# Patient Record
Sex: Male | Born: 1940 | Race: White | Hispanic: No | Marital: Married | Smoking: Current some day smoker
Health system: Southern US, Community
[De-identification: ages and names within clinical notes are randomized; demographics above are authoritative.]

## PROBLEM LIST (undated history)

## (undated) DIAGNOSIS — H903 Sensorineural hearing loss, bilateral: Secondary | ICD-10-CM

## (undated) DIAGNOSIS — F419 Anxiety disorder, unspecified: Secondary | ICD-10-CM

## (undated) DIAGNOSIS — N189 Chronic kidney disease, unspecified: Secondary | ICD-10-CM

## (undated) DIAGNOSIS — G20A1 Parkinson's disease without dyskinesia, without mention of fluctuations: Secondary | ICD-10-CM

---

## 2010-12-12 ENCOUNTER — Other Ambulatory Visit: Payer: Self-pay | Admitting: Internal Medicine

## 2010-12-12 ENCOUNTER — Ambulatory Visit
Admission: RE | Admit: 2010-12-12 | Discharge: 2010-12-12 | Disposition: A | Discharge status: Home / Self Care | Payer: Medicare Other | Source: Ambulatory Visit | Attending: Internal Medicine | Admitting: Internal Medicine

## 2010-12-12 DIAGNOSIS — M79609 Pain in unspecified limb: Secondary | ICD-10-CM

## 2014-10-09 DIAGNOSIS — L821 Other seborrheic keratosis: Secondary | ICD-10-CM | POA: Diagnosis not present

## 2014-10-09 DIAGNOSIS — L814 Other melanin hyperpigmentation: Secondary | ICD-10-CM | POA: Diagnosis not present

## 2014-10-09 DIAGNOSIS — D225 Melanocytic nevi of trunk: Secondary | ICD-10-CM | POA: Diagnosis not present

## 2014-10-09 DIAGNOSIS — L820 Inflamed seborrheic keratosis: Secondary | ICD-10-CM | POA: Diagnosis not present

## 2014-11-09 DIAGNOSIS — R972 Elevated prostate specific antigen [PSA]: Secondary | ICD-10-CM | POA: Diagnosis not present

## 2014-11-16 DIAGNOSIS — R972 Elevated prostate specific antigen [PSA]: Secondary | ICD-10-CM | POA: Diagnosis not present

## 2014-11-16 DIAGNOSIS — N400 Enlarged prostate without lower urinary tract symptoms: Secondary | ICD-10-CM | POA: Diagnosis not present

## 2014-12-07 DIAGNOSIS — R7301 Impaired fasting glucose: Secondary | ICD-10-CM | POA: Diagnosis not present

## 2014-12-07 DIAGNOSIS — Z1389 Encounter for screening for other disorder: Secondary | ICD-10-CM | POA: Diagnosis not present

## 2014-12-07 DIAGNOSIS — N400 Enlarged prostate without lower urinary tract symptoms: Secondary | ICD-10-CM | POA: Diagnosis not present

## 2014-12-07 DIAGNOSIS — H9319 Tinnitus, unspecified ear: Secondary | ICD-10-CM | POA: Diagnosis not present

## 2014-12-07 DIAGNOSIS — E781 Pure hyperglyceridemia: Secondary | ICD-10-CM | POA: Diagnosis not present

## 2014-12-07 DIAGNOSIS — E7800 Pure hypercholesterolemia, unspecified: Secondary | ICD-10-CM | POA: Diagnosis not present

## 2014-12-07 DIAGNOSIS — Z0000 Encounter for general adult medical examination without abnormal findings: Secondary | ICD-10-CM | POA: Diagnosis not present

## 2014-12-07 DIAGNOSIS — Z23 Encounter for immunization: Secondary | ICD-10-CM | POA: Diagnosis not present

## 2019-03-08 ENCOUNTER — Ambulatory Visit: Payer: Medicare Other

## 2019-03-16 ENCOUNTER — Ambulatory Visit: Payer: Medicare Other | Attending: Internal Medicine

## 2019-03-16 DIAGNOSIS — Z23 Encounter for immunization: Secondary | ICD-10-CM | POA: Insufficient documentation

## 2019-03-16 NOTE — Progress Notes (Signed)
   Covid-19 Vaccination Clinic  Name:  Donald Whitaker    MRN: [id number] DOB: 03/27/1940  03/16/2019  [name] was observed post Covid-19 immunization for 15 minutes without incidence. He was provided with Vaccine Information Sheet and instruction to access the V-Safe system.   [name] was instructed to call 911 with any severe reactions post vaccine: Marland Kitchen Difficulty breathing  . Swelling of your face and throat  . A fast heartbeat  . A bad rash all over your body  . Dizziness and weakness    Immunizations Administered    Name Date Dose VIS Date Route   Pfizer COVID-19 Vaccine 03/16/2019  8:48 AM 0.3 mL 01/17/2019 Intramuscular   Manufacturer: [employer], [employer]   Lot: [id number]   NDC: [address]-9

## 2019-03-19 ENCOUNTER — Ambulatory Visit: Payer: Medicare Other

## 2019-04-10 ENCOUNTER — Ambulatory Visit: Payer: BC Managed Care – PPO | Attending: Internal Medicine

## 2019-04-10 DIAGNOSIS — Z23 Encounter for immunization: Secondary | ICD-10-CM | POA: Insufficient documentation

## 2019-04-10 NOTE — Progress Notes (Signed)
   Covid-19 Vaccination Clinic  Name:  Donald Whitaker    MRN: [id number] DOB: [birth year]  04/10/2019  Donald Whitaker was observed post Covid-19 immunization for 15 minutes without incident. He was provided with Vaccine Information Sheet and instruction to access the V-Safe system.   Donald Whitaker was instructed to call 911 with any severe reactions post vaccine: Marland Kitchen Difficulty breathing  . Swelling of face and throat  . A fast heartbeat  . A bad rash all over body  . Dizziness and weakness   Immunizations Administered    Name Date Dose VIS Date Route   Pfizer COVID-19 Vaccine 04/10/2019 12:06 PM 0.3 mL 01/17/2019 Intramuscular   Manufacturer: [employer], [employer]   Lot: [id number]   NDC: [address]-2

## 2019-04-15 ENCOUNTER — Emergency Department (HOSPITAL_COMMUNITY)
Admission: EM | Admit: 2019-04-15 | Discharge: 2019-04-15 | Discharge status: Against Medical Advice | Payer: Medicare PPO | Attending: Emergency Medicine | Admitting: Emergency Medicine

## 2019-04-15 ENCOUNTER — Other Ambulatory Visit: Payer: Self-pay

## 2019-04-15 ENCOUNTER — Emergency Department (HOSPITAL_COMMUNITY): Payer: Medicare PPO

## 2019-04-15 ENCOUNTER — Encounter (HOSPITAL_COMMUNITY): Payer: Self-pay | Admitting: Emergency Medicine

## 2019-04-15 DIAGNOSIS — Z5321 Procedure and treatment not carried out due to patient leaving prior to being seen by health care provider: Secondary | ICD-10-CM | POA: Insufficient documentation

## 2019-04-15 DIAGNOSIS — R079 Chest pain, unspecified: Secondary | ICD-10-CM | POA: Diagnosis not present

## 2019-04-15 LAB — CBC
HCT: 47 % (ref 39.0–52.0)
Hemoglobin: 15.8 g/dL (ref 13.0–17.0)
MCH: 31.7 pg (ref 26.0–34.0)
MCHC: 33.6 g/dL (ref 30.0–36.0)
MCV: 94.4 fL (ref 80.0–100.0)
Platelets: 201 10*3/uL (ref 150–400)
RBC: 4.98 MIL/uL (ref 4.22–5.81)
RDW: 11.6 % (ref 11.5–15.5)
WBC: 10.4 10*3/uL (ref 4.0–10.5)
nRBC: 0 % (ref 0.0–0.2)

## 2019-04-15 LAB — BASIC METABOLIC PANEL
Anion gap: 8 (ref 5–15)
BUN: 18 mg/dL (ref 8–23)
CO2: 27 mmol/L (ref 22–32)
Calcium: 9 mg/dL (ref 8.9–10.3)
Chloride: 106 mmol/L (ref 98–111)
Creatinine, Ser: 1.42 mg/dL — ABNORMAL HIGH (ref 0.61–1.24)
GFR calc Af Amer: 54 mL/min — ABNORMAL LOW (ref >60)
GFR calc non Af Amer: 46 mL/min — ABNORMAL LOW (ref >60)
Glucose, Bld: 110 mg/dL — ABNORMAL HIGH (ref 70–99)
Potassium: 4 mmol/L (ref 3.5–5.1)
Sodium: 141 mmol/L (ref 135–145)

## 2019-04-15 LAB — TROPONIN I (HIGH SENSITIVITY): Troponin I (High Sensitivity): 8 ng/L (ref <18)

## 2019-04-15 MED ORDER — SODIUM CHLORIDE 0.9% FLUSH: 3.0000 mL | Freq: Once | INTRAVENOUS | Status: DC

## 2019-04-15 NOTE — ED Notes (Addendum)
Pt stated that the wait is ridiculous and that he's leaving. This tech advised pt to stay, pt called wife to pick him up. Told to return if symptoms worsen.

## 2019-04-15 NOTE — ED Triage Notes (Signed)
Per pt, the right side of his neck and right side of his chest has been causing him pain all day w/ tonight getting unbearable.  Sort staff indicated that pt had an unstable gait.  No neuro symptoms at this time.  Wife indicated that pt had been "OK" all day and only told him about it when he woke her up to bring him to the hospital.

## 2019-04-15 NOTE — ED Notes (Signed)
pts son Dandre Sisler would like an update when pt is given a room # is [phone]

## 2019-04-16 ENCOUNTER — Other Ambulatory Visit: Payer: Self-pay | Admitting: Internal Medicine

## 2019-04-16 DIAGNOSIS — R911 Solitary pulmonary nodule: Secondary | ICD-10-CM

## 2019-04-28 ENCOUNTER — Inpatient Hospital Stay: Admission: RE | Admit: 2019-04-28 | Payer: BC Managed Care – PPO | Source: Ambulatory Visit

## 2019-06-11 ENCOUNTER — Ambulatory Visit
Admission: RE | Admit: 2019-06-11 | Discharge: 2019-06-11 | Disposition: A | Discharge status: Home / Self Care | Payer: Medicare PPO | Source: Ambulatory Visit | Attending: Internal Medicine | Admitting: Internal Medicine

## 2019-06-11 ENCOUNTER — Other Ambulatory Visit: Payer: Self-pay | Admitting: Internal Medicine

## 2019-06-11 DIAGNOSIS — M542 Cervicalgia: Secondary | ICD-10-CM

## 2019-06-11 DIAGNOSIS — M47812 Spondylosis without myelopathy or radiculopathy, cervical region: Secondary | ICD-10-CM | POA: Diagnosis not present

## 2019-06-11 DIAGNOSIS — R911 Solitary pulmonary nodule: Secondary | ICD-10-CM | POA: Diagnosis not present

## 2019-06-27 ENCOUNTER — Ambulatory Visit
Admission: RE | Admit: 2019-06-27 | Discharge: 2019-06-27 | Disposition: A | Discharge status: Home / Self Care | Payer: Medicare PPO | Source: Ambulatory Visit | Attending: Internal Medicine | Admitting: Internal Medicine

## 2019-06-27 DIAGNOSIS — R911 Solitary pulmonary nodule: Secondary | ICD-10-CM

## 2019-06-27 DIAGNOSIS — R918 Other nonspecific abnormal finding of lung field: Secondary | ICD-10-CM | POA: Diagnosis not present

## 2019-06-27 MED ORDER — IOPAMIDOL (ISOVUE-300) INJECTION 61%
75.0000 mL | Freq: Once | INTRAVENOUS | Status: AC | PRN
  Administered 2019-06-27: 75 mL via INTRAVENOUS

## 2019-07-04 DIAGNOSIS — I700 Atherosclerosis of aorta: Secondary | ICD-10-CM | POA: Diagnosis not present

## 2019-07-04 DIAGNOSIS — M5090 Cervical disc disorder, unspecified, unspecified cervical region: Secondary | ICD-10-CM | POA: Diagnosis not present

## 2019-09-09 DIAGNOSIS — D225 Melanocytic nevi of trunk: Secondary | ICD-10-CM | POA: Diagnosis not present

## 2019-09-09 DIAGNOSIS — D1801 Hemangioma of skin and subcutaneous tissue: Secondary | ICD-10-CM | POA: Diagnosis not present

## 2019-09-09 DIAGNOSIS — L89899 Pressure ulcer of other site, unspecified stage: Secondary | ICD-10-CM | POA: Diagnosis not present

## 2019-09-09 DIAGNOSIS — L821 Other seborrheic keratosis: Secondary | ICD-10-CM | POA: Diagnosis not present

## 2019-11-18 DIAGNOSIS — H40013 Open angle with borderline findings, low risk, bilateral: Secondary | ICD-10-CM | POA: Diagnosis not present

## 2019-11-18 DIAGNOSIS — H43392 Other vitreous opacities, left eye: Secondary | ICD-10-CM | POA: Diagnosis not present

## 2019-11-18 DIAGNOSIS — H43811 Vitreous degeneration, right eye: Secondary | ICD-10-CM | POA: Diagnosis not present

## 2019-11-18 DIAGNOSIS — H21233 Degeneration of iris (pigmentary), bilateral: Secondary | ICD-10-CM | POA: Diagnosis not present

## 2019-11-18 DIAGNOSIS — H2513 Age-related nuclear cataract, bilateral: Secondary | ICD-10-CM | POA: Diagnosis not present

## 2019-11-22 ENCOUNTER — Ambulatory Visit: Payer: Medicare PPO | Attending: Internal Medicine

## 2019-11-22 DIAGNOSIS — Z23 Encounter for immunization: Secondary | ICD-10-CM

## 2019-11-22 NOTE — Progress Notes (Signed)
   Covid-19 Vaccination Clinic  Name:  Donald Whitaker    MRN: [id number] DOB: [birth year]  11/22/2019  [name] was observed post Covid-19 immunization for 15 minutes without incident. He was provided with Vaccine Information Sheet and instruction to access the V-Safe system.   [name] was instructed to call 911 with any severe reactions post vaccine: Marland Kitchen Difficulty breathing  . Swelling of face and throat  . A fast heartbeat  . A bad rash all over body  . Dizziness and weakness

## 2020-01-09 DIAGNOSIS — R972 Elevated prostate specific antigen [PSA]: Secondary | ICD-10-CM | POA: Diagnosis not present

## 2020-01-23 DIAGNOSIS — R972 Elevated prostate specific antigen [PSA]: Secondary | ICD-10-CM | POA: Diagnosis not present

## 2020-01-23 DIAGNOSIS — N5201 Erectile dysfunction due to arterial insufficiency: Secondary | ICD-10-CM | POA: Diagnosis not present

## 2020-02-19 DIAGNOSIS — R972 Elevated prostate specific antigen [PSA]: Secondary | ICD-10-CM | POA: Diagnosis not present

## 2020-02-19 DIAGNOSIS — T148XXA Other injury of unspecified body region, initial encounter: Secondary | ICD-10-CM | POA: Diagnosis not present

## 2020-02-19 DIAGNOSIS — Z1389 Encounter for screening for other disorder: Secondary | ICD-10-CM | POA: Diagnosis not present

## 2020-02-19 DIAGNOSIS — R7309 Other abnormal glucose: Secondary | ICD-10-CM | POA: Diagnosis not present

## 2020-02-19 DIAGNOSIS — N182 Chronic kidney disease, stage 2 (mild): Secondary | ICD-10-CM | POA: Diagnosis not present

## 2020-02-19 DIAGNOSIS — I700 Atherosclerosis of aorta: Secondary | ICD-10-CM | POA: Diagnosis not present

## 2020-02-19 DIAGNOSIS — Z0000 Encounter for general adult medical examination without abnormal findings: Secondary | ICD-10-CM | POA: Diagnosis not present

## 2020-02-19 DIAGNOSIS — E782 Mixed hyperlipidemia: Secondary | ICD-10-CM | POA: Diagnosis not present

## 2020-06-22 DIAGNOSIS — T148XXA Other injury of unspecified body region, initial encounter: Secondary | ICD-10-CM | POA: Diagnosis not present

## 2020-06-22 DIAGNOSIS — M549 Dorsalgia, unspecified: Secondary | ICD-10-CM | POA: Diagnosis not present

## 2020-06-22 DIAGNOSIS — S301XXA Contusion of abdominal wall, initial encounter: Secondary | ICD-10-CM | POA: Diagnosis not present

## 2020-09-08 DIAGNOSIS — L821 Other seborrheic keratosis: Secondary | ICD-10-CM | POA: Diagnosis not present

## 2020-09-08 DIAGNOSIS — L738 Other specified follicular disorders: Secondary | ICD-10-CM | POA: Diagnosis not present

## 2020-09-08 DIAGNOSIS — D229 Melanocytic nevi, unspecified: Secondary | ICD-10-CM | POA: Diagnosis not present

## 2020-09-08 DIAGNOSIS — L814 Other melanin hyperpigmentation: Secondary | ICD-10-CM | POA: Diagnosis not present

## 2020-09-08 DIAGNOSIS — D485 Neoplasm of uncertain behavior of skin: Secondary | ICD-10-CM | POA: Diagnosis not present

## 2020-09-08 DIAGNOSIS — I8393 Asymptomatic varicose veins of bilateral lower extremities: Secondary | ICD-10-CM | POA: Diagnosis not present

## 2020-09-08 DIAGNOSIS — L910 Hypertrophic scar: Secondary | ICD-10-CM | POA: Diagnosis not present

## 2020-09-08 DIAGNOSIS — L708 Other acne: Secondary | ICD-10-CM | POA: Diagnosis not present

## 2020-09-08 DIAGNOSIS — L819 Disorder of pigmentation, unspecified: Secondary | ICD-10-CM | POA: Diagnosis not present

## 2020-09-16 DIAGNOSIS — R2681 Unsteadiness on feet: Secondary | ICD-10-CM | POA: Diagnosis not present

## 2020-09-16 DIAGNOSIS — R29898 Other symptoms and signs involving the musculoskeletal system: Secondary | ICD-10-CM | POA: Diagnosis not present

## 2020-09-16 DIAGNOSIS — R269 Unspecified abnormalities of gait and mobility: Secondary | ICD-10-CM | POA: Diagnosis not present

## 2020-09-17 ENCOUNTER — Encounter: Payer: Self-pay | Admitting: Neurology

## 2020-09-17 ENCOUNTER — Other Ambulatory Visit: Payer: Self-pay | Admitting: Internal Medicine

## 2020-09-17 DIAGNOSIS — R269 Unspecified abnormalities of gait and mobility: Secondary | ICD-10-CM

## 2020-09-25 ENCOUNTER — Ambulatory Visit
Admission: RE | Admit: 2020-09-25 | Discharge: 2020-09-25 | Disposition: A | Discharge status: Home / Self Care | Payer: Medicare PPO | Source: Ambulatory Visit | Attending: Internal Medicine | Admitting: Internal Medicine

## 2020-09-25 DIAGNOSIS — R269 Unspecified abnormalities of gait and mobility: Secondary | ICD-10-CM

## 2020-09-25 DIAGNOSIS — R531 Weakness: Secondary | ICD-10-CM | POA: Diagnosis not present

## 2020-09-25 DIAGNOSIS — R29898 Other symptoms and signs involving the musculoskeletal system: Secondary | ICD-10-CM | POA: Diagnosis not present

## 2020-11-01 NOTE — Progress Notes (Signed)
NEUROLOGY CONSULTATION NOTE  OTHA MONICAL MRN: [id number] DOB: Sep 19, 1940  Referring provider: Georgann Housekeeper, MD Primary care provider: Georgann Housekeeper, MD  Reason for consult:  bilateral leg weakness, unsteady gait  Assessment/Plan:   Unsteady gait/frequent falls.  Unclear etiology.  In regards to the peripheral nervous system, he does not exhibit any obvious muscle weakness, neuropathy in feet or lumbosacral radiculopathy.  In regards to the central nervous system, he does not exhibit any spasticity or hyperreflexia to suggest myelopathy, no evidence of NPH, mass or stroke on brain imaging.  An underlying neurodegenerative disease considered.  I will first check MRI of brain with and without contrast.  If unremarkable/unrevealing, then I would check the spinal cord and work from there Follow up after testing.   Subjective:  Donald Whitaker is an 80 year old right-handed male with CKD and bilateral sensorineural hearing loss who presents for bilateral lower extremity weakness and unsteady gait.  History supplemented by referring provider's note.  He is accompanied by his son who provides additional history.  Around April, he had a fall and couldn't get up on his own.  Then around June, he began noticing unsteady gait.  He says he feels weak in the legs.  When he walks he feels like he is going to tip over.  He has had 7 falls over the past 3 months.  He has been athletic and regularly walked and used the gym with weights.  He used to walk 60 minutes, now only 10 minutes.  No back pain, pain in legs or numbness in legs and feet.  He is able to walk up and down the stairs holding the bannister but without difficulty.  Has history of cervical spondylosis but nothing significant and denies neck pain.  Denies back pain, leg pain or numbness in the feet.  Denies involvement of the upper extremities.  Denies dizziness, diplopia or dysphagia.  No bowel or bladder dysfunction.  Labs from  August include CPK 44.  CT head without contrast on 09/25/2020 personally reviewed demonstrated brain atrophy and chronic small vessel ischemic changes but no acute abnormality or out of proportion ventriculomegaly.      PAST MEDICAL HISTORY: No past medical history on file.  PAST SURGICAL HISTORY: No past surgical history on file.  MEDICATIONS: None     ALLERGIES: No Known Allergies  FAMILY HISTORY: No family history on file.  Objective:  Blood pressure (!) 142/80, pulse 60, height 6' (1.829 m), weight 198 lb 3.2 oz (89.9 kg), SpO2 94 %. General: No acute distress.  Patient appears well-groomed.   Head:  Normocephalic/atraumatic Eyes:  fundi examined but not visualized Neck: supple, no paraspinal tenderness, full range of motion Back: No paraspinal tenderness Heart: regular rate and rhythm Lungs: Clear to auscultation bilaterally. Vascular: No carotid bruits. Neurological Exam: Mental status: alert and oriented to person, place, and time, recent and remote memory intact, fund of knowledge intact, attention and concentration intact, speech fluent and not dysarthric, language intact. Cranial nerves: CN I: not tested CN II: pupils equal, round and reactive to light, visual fields intact CN III, IV, VI:  full range of motion, no nystagmus, no ptosis CN V: facial sensation intact. CN VII: upper and lower face symmetric CN VIII: hearing intact CN IX, X: gag intact, uvula midline CN XI: sternocleidomastoid and trapezius muscles intact CN XII: tongue midline Bulk & Tone: normal, no fasciculations. Motor:  muscle strength 5/5 throughout Sensation:  Pinprick, temperature and vibratory sensation intact. Deep  Tendon Reflexes:  2+ throughout,  toes downgoing.   Finger to nose testing:  Without dysmetria.   Heel to shin:  Without dysmetria.   Gait:  upright posture, broad-based gait.  Right leg appears to occasionally slightly drag.  Unable to tandem walk.  Romberg  negative.    Thank you for allowing me to take part in the care of this patient.  Shon Millet, DO  CC: Georgann Housekeeper, MD

## 2020-11-02 ENCOUNTER — Ambulatory Visit: Payer: Medicare PPO | Admitting: Neurology

## 2020-11-02 ENCOUNTER — Other Ambulatory Visit: Payer: Self-pay

## 2020-11-02 ENCOUNTER — Encounter: Payer: Self-pay | Admitting: Neurology

## 2020-11-02 VITALS — BP 142/80 | HR 60 | Ht 72.0 in | Wt 198.2 lb

## 2020-11-02 DIAGNOSIS — R270 Ataxia, unspecified: Secondary | ICD-10-CM | POA: Diagnosis not present

## 2020-11-02 NOTE — Patient Instructions (Addendum)
Check MRI of brain with and without contrast Further recommendations pending results but if MRI unremarkable, will check MRI of spinal cord. Follow up after testing.  We have sent a referral to [facility] for your MRI and they will call you directly to schedule your appointment. They are located at [address] [facility]. If you need to contact them directly please call [phone].

## 2020-11-05 ENCOUNTER — Telehealth: Payer: Self-pay | Admitting: Neurology

## 2020-11-05 NOTE — Telephone Encounter (Signed)
Advised Pt wife that the Order from Dr.Karrar was  a CT Head.  Dr.Jaffe ordered a MRI different test.

## 2020-11-05 NOTE — Telephone Encounter (Signed)
Patient called in stating Dr. Everlena Cooper wanted him to have an MRI, but he had had one about a month ago that Dr. Donette Larry ordered. He wants to know if he needs to do another one? It is scheduled for 11/13/20

## 2020-11-10 DIAGNOSIS — Z23 Encounter for immunization: Secondary | ICD-10-CM | POA: Diagnosis not present

## 2020-11-13 ENCOUNTER — Ambulatory Visit
Admission: RE | Admit: 2020-11-13 | Discharge: 2020-11-13 | Disposition: A | Discharge status: Home / Self Care | Payer: Medicare PPO | Source: Ambulatory Visit | Attending: Neurology | Admitting: Neurology

## 2020-11-13 DIAGNOSIS — R270 Ataxia, unspecified: Secondary | ICD-10-CM | POA: Diagnosis not present

## 2020-11-13 DIAGNOSIS — R531 Weakness: Secondary | ICD-10-CM | POA: Diagnosis not present

## 2020-11-13 DIAGNOSIS — G9389 Other specified disorders of brain: Secondary | ICD-10-CM | POA: Diagnosis not present

## 2020-11-13 DIAGNOSIS — G319 Degenerative disease of nervous system, unspecified: Secondary | ICD-10-CM | POA: Diagnosis not present

## 2020-11-13 MED ORDER — GADOBENATE DIMEGLUMINE 529 MG/ML IV SOLN
18.0000 mL | Freq: Once | INTRAVENOUS | Status: AC | PRN
  Administered 2020-11-13: 18 mL via INTRAVENOUS

## 2020-11-17 DIAGNOSIS — R7989 Other specified abnormal findings of blood chemistry: Secondary | ICD-10-CM | POA: Diagnosis not present

## 2020-11-17 DIAGNOSIS — R946 Abnormal results of thyroid function studies: Secondary | ICD-10-CM | POA: Diagnosis not present

## 2020-11-19 DIAGNOSIS — H2513 Age-related nuclear cataract, bilateral: Secondary | ICD-10-CM | POA: Diagnosis not present

## 2020-11-19 DIAGNOSIS — H40013 Open angle with borderline findings, low risk, bilateral: Secondary | ICD-10-CM | POA: Diagnosis not present

## 2020-11-19 DIAGNOSIS — H43392 Other vitreous opacities, left eye: Secondary | ICD-10-CM | POA: Diagnosis not present

## 2020-11-19 DIAGNOSIS — H21233 Degeneration of iris (pigmentary), bilateral: Secondary | ICD-10-CM | POA: Diagnosis not present

## 2020-11-19 DIAGNOSIS — H43811 Vitreous degeneration, right eye: Secondary | ICD-10-CM | POA: Diagnosis not present

## 2020-11-23 ENCOUNTER — Telehealth: Payer: Self-pay | Admitting: Neurology

## 2020-11-23 NOTE — Telephone Encounter (Signed)
Pt left message with Acces Nurse. She wants to get her MRI results

## 2020-11-23 NOTE — Telephone Encounter (Signed)
MRI results: MRI of brain shows some expected age-related changes but nothing to explain the weakness and balance problems.  I would like to check an MRI of the cervical spine without contrast to evaluate for cervical spinal stenosis, cervical myelopathy, ataxia   Called patient 11/23/2020 10:40am and unable to leave a message due to mailbox not accepting calls.

## 2020-11-24 NOTE — Telephone Encounter (Signed)
LMOVM for pt to call back 

## 2020-11-25 NOTE — Telephone Encounter (Signed)
Pt advised of MRI results. Pt wants to wait on the MRI Cervical spine. Per pt his balance has gotten better. As for the Weakness it is still there. But he wants to wait it out and see.

## 2020-11-25 NOTE — Telephone Encounter (Signed)
Pt is returning a call to sheena for results

## 2020-11-30 ENCOUNTER — Telehealth: Payer: Self-pay | Admitting: Neurology

## 2020-11-30 DIAGNOSIS — R29898 Other symptoms and signs involving the musculoskeletal system: Secondary | ICD-10-CM

## 2020-11-30 DIAGNOSIS — R270 Ataxia, unspecified: Secondary | ICD-10-CM

## 2020-11-30 NOTE — Telephone Encounter (Signed)
Tried calling pt back no answer. Unable to LVM.

## 2020-11-30 NOTE — Telephone Encounter (Signed)
Patient wants to talk to some one about possibly the next MRI that he would need since the MRI of the Brain did not show anything. He would like a call at 8-9 in the morning time.  He thinks it was a MRI of the spine ( the next MRI he needed )  Please call

## 2020-12-01 NOTE — Telephone Encounter (Signed)
Patient returned Sheena call I offered to put him back to the VM since sheena was with a patient, he states that he will just call back later

## 2020-12-01 NOTE — Telephone Encounter (Signed)
2nd attempt at calling pt, No answer. LMOVM to call the office back.

## 2020-12-01 NOTE — Telephone Encounter (Signed)
Pt returned our call, Per pt the weakness in his lower limbs hasn't gotten any better.  Please send a referral for MRI Cervical spine with and without Contrast.

## 2020-12-07 ENCOUNTER — Ambulatory Visit: Payer: Medicare PPO | Admitting: Neurology

## 2020-12-18 ENCOUNTER — Other Ambulatory Visit: Payer: Self-pay

## 2020-12-18 ENCOUNTER — Ambulatory Visit
Admission: RE | Admit: 2020-12-18 | Discharge: 2020-12-18 | Disposition: A | Discharge status: Home / Self Care | Payer: Medicare PPO | Source: Ambulatory Visit | Attending: Neurology | Admitting: Neurology

## 2020-12-18 DIAGNOSIS — R270 Ataxia, unspecified: Secondary | ICD-10-CM | POA: Diagnosis not present

## 2020-12-18 DIAGNOSIS — S199XXA Unspecified injury of neck, initial encounter: Secondary | ICD-10-CM | POA: Diagnosis not present

## 2020-12-18 DIAGNOSIS — R29898 Other symptoms and signs involving the musculoskeletal system: Secondary | ICD-10-CM

## 2020-12-18 DIAGNOSIS — M4692 Unspecified inflammatory spondylopathy, cervical region: Secondary | ICD-10-CM | POA: Diagnosis not present

## 2020-12-18 DIAGNOSIS — M2578 Osteophyte, vertebrae: Secondary | ICD-10-CM | POA: Diagnosis not present

## 2020-12-18 MED ORDER — GADOBENATE DIMEGLUMINE 529 MG/ML IV SOLN
18.0000 mL | Freq: Once | INTRAVENOUS | Status: AC | PRN
  Administered 2020-12-18: 18 mL via INTRAVENOUS

## 2020-12-20 ENCOUNTER — Telehealth: Payer: Self-pay

## 2020-12-20 DIAGNOSIS — R29898 Other symptoms and signs involving the musculoskeletal system: Secondary | ICD-10-CM

## 2020-12-20 NOTE — Telephone Encounter (Signed)
-----   Message from Drema Dallas, DO sent at 12/20/2020  7:34 AM EST ----- MRI of cervical spine shows a bulging disc pressing into the spinal cord.  This is likely causing the balance problems.  I would like to send urgent consult to neurosurgery.

## 2020-12-20 NOTE — Telephone Encounter (Signed)
Patient advised. Referral faxed over with Urgent at the top of the Referral [facility].

## 2020-12-21 DIAGNOSIS — G959 Disease of spinal cord, unspecified: Secondary | ICD-10-CM | POA: Diagnosis not present

## 2020-12-21 DIAGNOSIS — R030 Elevated blood-pressure reading, without diagnosis of hypertension: Secondary | ICD-10-CM | POA: Diagnosis not present

## 2020-12-23 ENCOUNTER — Other Ambulatory Visit: Payer: Self-pay | Admitting: Neurosurgery

## 2021-01-03 NOTE — Progress Notes (Signed)
Surgical Instructions    Your procedure is scheduled on Friday December 2nd .  Report to [facility] Main Entrance "A" at 9 A.M., then check in with the Admitting office.  Call this number if you have problems the morning of surgery:  [phone]   If you have any questions prior to your surgery date call [phone]: Open Monday-Friday 8am-4pm    Remember:  Do not eat or drink after midnight the night before your surgery     Take these medicines the morning of surgery with A SIP OF WATER  NONE   As of today, STOP taking any Aspirin (unless otherwise instructed by your surgeon) Aleve, Naproxen, Ibuprofen, Motrin, Advil, Goody's, BC's, all herbal medications, fish oil, and all vitamins.     After your COVID test   You are not required to quarantine however you are required to wear a well-fitting mask when you are out and around people not in your household.  If your mask becomes wet or soiled, replace with a new one.  Wash your hands often with soap and water for 20 seconds or clean your hands with an alcohol-based hand sanitizer that contains at least 60% alcohol.  Do not share personal items.  Notify your provider: if you are in close contact with someone who has COVID  or if you develop a fever of 100.4 or greater, sneezing, cough, sore throat, shortness of breath or body aches.             Do not wear jewelry  Do not wear lotions, powders, colognes, or deodorant. Do not shave 48 hours prior to surgery.  Men may shave face and neck. Do not bring valuables to the hospital. DO Not wear nail polish, gel polish, artificial nails, or any other type of covering on natural nails including finger and toenails. If patients have artificial nails, gel coating, etc. that need to be removed by a nail salon, please have this removed prior to surgery or surgery may need to be canceled/delayed if the surgeon/ anesthesia feels like the patient is unable to be adequately monitored.              [facility] is not responsible for any belongings or valuables.  Do NOT Smoke (Tobacco/Vaping)  24 hours prior to your procedure  If you use a CPAP at night, you may bring your mask for your overnight stay.   Contacts, glasses, hearing aids, dentures or partials may not be worn into surgery, please bring cases for these belongings   For patients admitted to the hospital, discharge time will be determined by your treatment team.   Patients discharged the day of surgery will not be allowed to drive home, and someone needs to stay with them for 24 hours.  NO VISITORS WILL BE ALLOWED IN PRE-OP WHERE PATIENTS ARE PREPPED FOR SURGERY.  ONLY 1 SUPPORT PERSON MAY BE PRESENT IN THE WAITING ROOM WHILE YOU ARE IN SURGERY.  IF YOU ARE TO BE ADMITTED, ONCE YOU ARE IN YOUR ROOM YOU WILL BE ALLOWED TWO (2) VISITORS. 1 (ONE) VISITOR MAY STAY OVERNIGHT BUT MUST ARRIVE TO THE ROOM BY 8pm.  Minor children may have two parents present. Special consideration for safety and communication needs will be reviewed on a case by case basis.  Special instructions:    Oral Hygiene is also important to reduce your risk of infection.  Remember - BRUSH YOUR TEETH THE MORNING OF SURGERY WITH YOUR REGULAR TOOTHPASTE   [facility]- Preparing  For Surgery  Before surgery, you can play an important role. Because skin is not sterile, your skin needs to be as free of germs as possible. You can reduce the number of germs on your skin by washing with CHG (chlorahexidine gluconate) Soap before surgery.  CHG is an antiseptic cleaner which kills germs and bonds with the skin to continue killing germs even after washing.     Please do not use if you have an allergy to CHG or antibacterial soaps. If your skin becomes reddened/irritated stop using the CHG.  Do not shave (including legs and underarms) for at least 48 hours prior to first CHG shower. It is OK to shave your face.  Please follow these instructions carefully.      Shower the NIGHT BEFORE SURGERY and the MORNING OF SURGERY with CHG Soap.   If you chose to wash your hair, wash your hair first as usual with your normal shampoo. After you shampoo, rinse your hair and body thoroughly to remove the shampoo.  Then [employer] and genitals (private parts) with your normal soap and rinse thoroughly to remove soap.  After that Use CHG Soap as you would any other liquid soap. You can apply CHG directly to the skin and wash gently with a scrungie or a clean washcloth.   Apply the CHG Soap to your body ONLY FROM THE NECK DOWN.  Do not use on open wounds or open sores. Avoid contact with your eyes, ears, mouth and genitals (private parts). Wash Face and genitals (private parts)  with your normal soap.   Wash thoroughly, paying special attention to the area where your surgery will be performed.  Thoroughly rinse your body with warm water from the neck down.  DO NOT shower/wash with your normal soap after using and rinsing off the CHG Soap.  Pat yourself dry with a CLEAN TOWEL.  Wear CLEAN PAJAMAS to bed the night before surgery  Place CLEAN SHEETS on your bed the night before your surgery  DO NOT SLEEP WITH PETS.   Day of Surgery:  Take a shower with CHG soap. Wear Clean/Comfortable clothing the morning of surgery Do not apply any deodorants/lotions.   Remember to brush your teeth WITH YOUR REGULAR TOOTHPASTE.   Please read over the following fact sheets that you were given.

## 2021-01-04 ENCOUNTER — Other Ambulatory Visit: Payer: Self-pay

## 2021-01-04 ENCOUNTER — Encounter (HOSPITAL_COMMUNITY)
Admission: RE | Admit: 2021-01-04 | Discharge: 2021-01-04 | Disposition: A | Discharge status: Home / Self Care | Payer: Medicare PPO | Source: Ambulatory Visit | Attending: Neurosurgery | Admitting: Neurosurgery

## 2021-01-04 ENCOUNTER — Encounter (HOSPITAL_COMMUNITY): Payer: Self-pay

## 2021-01-04 VITALS — BP 154/89 | HR 61 | Temp 98.4°F | Resp 17 | Ht 72.0 in | Wt 196.1 lb

## 2021-01-04 DIAGNOSIS — Z01818 Encounter for other preprocedural examination: Secondary | ICD-10-CM | POA: Insufficient documentation

## 2021-01-04 DIAGNOSIS — Z20822 Contact with and (suspected) exposure to covid-19: Secondary | ICD-10-CM | POA: Insufficient documentation

## 2021-01-04 DIAGNOSIS — M50021 Cervical disc disorder at C4-C5 level with myelopathy: Secondary | ICD-10-CM | POA: Diagnosis not present

## 2021-01-04 HISTORY — DX: Chronic kidney disease, unspecified: N18.9

## 2021-01-04 HISTORY — DX: Sensorineural hearing loss, bilateral: H90.3

## 2021-01-04 HISTORY — DX: Anxiety disorder, unspecified: F41.9

## 2021-01-04 LAB — BASIC METABOLIC PANEL
Anion gap: 7 (ref 5–15)
BUN: 19 mg/dL (ref 8–23)
CO2: 29 mmol/L (ref 22–32)
Calcium: 9.1 mg/dL (ref 8.9–10.3)
Chloride: 106 mmol/L (ref 98–111)
Creatinine, Ser: 1.17 mg/dL (ref 0.61–1.24)
GFR, Estimated: >60 mL/min (ref >60)
Glucose, Bld: 85 mg/dL (ref 70–99)
Potassium: 3.9 mmol/L (ref 3.5–5.1)
Sodium: 142 mmol/L (ref 135–145)

## 2021-01-04 LAB — CBC
HCT: 49.5 % (ref 39.0–52.0)
Hemoglobin: 16.3 g/dL (ref 13.0–17.0)
MCH: 31.7 pg (ref 26.0–34.0)
MCHC: 32.9 g/dL (ref 30.0–36.0)
MCV: 96.3 fL (ref 80.0–100.0)
Platelets: 212 10*3/uL (ref 150–400)
RBC: 5.14 MIL/uL (ref 4.22–5.81)
RDW: 11.8 % (ref 11.5–15.5)
WBC: 8.3 10*3/uL (ref 4.0–10.5)
nRBC: 0 % (ref 0.0–0.2)

## 2021-01-04 LAB — SURGICAL PCR SCREEN
MRSA, PCR: NEGATIVE
Staphylococcus aureus: NEGATIVE

## 2021-01-04 NOTE — Progress Notes (Signed)
PCP - Georgann Housekeeper, MD Cardiologist - denies  PPM/ICD - denies Device Orders - n/a Rep Notified - n/a  Chest x-ray - n/a EKG - 01/04/2021 Stress Test - denies ECHO - denies Cardiac Cath - denies  Sleep Study - denies CPAP - n/a  Fasting Blood Sugar - n/a  Blood Thinner Instructions: n/a  Aspirin Instructions: Patient was instructed: As of today, STOP taking any Aspirin (unless otherwise instructed by your surgeon) Aleve, Naproxen, Ibuprofen, Motrin, Advil, Goody's, BC's, all herbal medications, fish oil, and all vitamins.  ERAS Protcol - n/a  COVID TEST- done in PAT 01/04/2021   Anesthesia review: no  Patient denies shortness of breath, fever, cough and chest pain at PAT appointment   All instructions explained to the patient, with a verbal understanding of the material. Patient agrees to go over the instructions while at home for a better understanding. Patient also instructed to self quarantine after being tested for COVID-19. The opportunity to ask questions was provided.

## 2021-01-05 LAB — SARS CORONAVIRUS 2 (TAT 6-24 HRS): SARS Coronavirus 2: NEGATIVE

## 2021-01-07 ENCOUNTER — Ambulatory Visit (HOSPITAL_COMMUNITY): Payer: Medicare PPO

## 2021-01-07 ENCOUNTER — Ambulatory Visit (HOSPITAL_COMMUNITY): Payer: Medicare PPO | Admitting: Certified Registered Nurse Anesthetist

## 2021-01-07 ENCOUNTER — Ambulatory Visit (HOSPITAL_COMMUNITY)
Admission: RE | Admit: 2021-01-07 | Discharge: 2021-01-08 | Disposition: A | Discharge status: Home / Self Care | Payer: Medicare PPO | Attending: Neurosurgery | Admitting: Neurosurgery

## 2021-01-07 ENCOUNTER — Encounter (HOSPITAL_COMMUNITY)
Admission: RE | Disposition: A | Discharge status: Home / Self Care | Payer: Self-pay | Source: Home / Self Care | Attending: Neurosurgery

## 2021-01-07 ENCOUNTER — Encounter (HOSPITAL_COMMUNITY): Payer: Self-pay | Admitting: Neurosurgery

## 2021-01-07 ENCOUNTER — Other Ambulatory Visit: Payer: Self-pay

## 2021-01-07 DIAGNOSIS — M4322 Fusion of spine, cervical region: Secondary | ICD-10-CM | POA: Diagnosis not present

## 2021-01-07 DIAGNOSIS — F419 Anxiety disorder, unspecified: Secondary | ICD-10-CM | POA: Insufficient documentation

## 2021-01-07 DIAGNOSIS — M4712 Other spondylosis with myelopathy, cervical region: Secondary | ICD-10-CM | POA: Insufficient documentation

## 2021-01-07 DIAGNOSIS — Z981 Arthrodesis status: Secondary | ICD-10-CM | POA: Diagnosis not present

## 2021-01-07 DIAGNOSIS — M5000 Cervical disc disorder with myelopathy, unspecified cervical region: Secondary | ICD-10-CM | POA: Diagnosis not present

## 2021-01-07 DIAGNOSIS — Z419 Encounter for procedure for purposes other than remedying health state, unspecified: Secondary | ICD-10-CM

## 2021-01-07 DIAGNOSIS — M4802 Spinal stenosis, cervical region: Secondary | ICD-10-CM | POA: Insufficient documentation

## 2021-01-07 DIAGNOSIS — M2578 Osteophyte, vertebrae: Secondary | ICD-10-CM | POA: Diagnosis not present

## 2021-01-07 DIAGNOSIS — F17290 Nicotine dependence, other tobacco product, uncomplicated: Secondary | ICD-10-CM | POA: Insufficient documentation

## 2021-01-07 HISTORY — PX: ANTERIOR CERVICAL DECOMP/DISCECTOMY FUSION: SHX1161

## 2021-01-07 SURGERY — ANTERIOR CERVICAL DECOMPRESSION/DISCECTOMY FUSION 1 LEVEL
Anesthesia: General

## 2021-01-07 MED ORDER — ACETAMINOPHEN 500 MG PO TABS
1000.0000 mg | ORAL_TABLET | Freq: Once | ORAL | Status: AC
  Administered 2021-01-07: 1000 mg via ORAL
  Filled 2021-01-07: qty 2

## 2021-01-07 MED ORDER — LIDOCAINE 2% (20 MG/ML) 5 ML SYRINGE
INTRAMUSCULAR | Status: DC | PRN
  Administered 2021-01-07: 60 mg via INTRAVENOUS

## 2021-01-07 MED ORDER — FENTANYL CITRATE (PF) 250 MCG/5ML IJ SOLN
INTRAMUSCULAR | Status: AC
  Filled 2021-01-07: qty 5

## 2021-01-07 MED ORDER — CHLORHEXIDINE GLUCONATE CLOTH 2 % EX PADS: 6.0000 | MEDICATED_PAD | Freq: Once | CUTANEOUS | Status: DC

## 2021-01-07 MED ORDER — HYDROCODONE-ACETAMINOPHEN 5-325 MG PO TABS: 2.0000 | ORAL_TABLET | ORAL | Status: DC | PRN

## 2021-01-07 MED ORDER — THROMBIN 5000 UNITS EX SOLR
CUTANEOUS | Status: DC | PRN
  Administered 2021-01-07 (×2): 5000 [IU] via TOPICAL

## 2021-01-07 MED ORDER — ACETAMINOPHEN 500 MG PO TABS
ORAL_TABLET | ORAL | Status: AC
  Filled 2021-01-07: qty 2

## 2021-01-07 MED ORDER — THROMBIN 5000 UNITS EX SOLR: OROMUCOSAL | Status: DC | PRN

## 2021-01-07 MED ORDER — 0.9 % SODIUM CHLORIDE (POUR BTL) OPTIME
TOPICAL | Status: DC | PRN
  Administered 2021-01-07: 1000 mL

## 2021-01-07 MED ORDER — ALUM & MAG HYDROXIDE-SIMETH 200-200-20 MG/5ML PO SUSP: 30.0000 mL | Freq: Four times a day (QID) | ORAL | Status: DC | PRN

## 2021-01-07 MED ORDER — HEMOSTATIC AGENTS (NO CHARGE) OPTIME
TOPICAL | Status: DC | PRN
  Administered 2021-01-07: 1 via TOPICAL

## 2021-01-07 MED ORDER — PHENOL 1.4 % MT LIQD: 1.0000 | OROMUCOSAL | Status: DC | PRN

## 2021-01-07 MED ORDER — DEXAMETHASONE SODIUM PHOSPHATE 10 MG/ML IJ SOLN
10.0000 mg | Freq: Once | INTRAMUSCULAR | Status: AC
  Administered 2021-01-07: 10 mg via INTRAVENOUS
  Filled 2021-01-07: qty 1

## 2021-01-07 MED ORDER — DEXAMETHASONE SODIUM PHOSPHATE 10 MG/ML IJ SOLN
INTRAMUSCULAR | Status: AC
  Filled 2021-01-07: qty 1

## 2021-01-07 MED ORDER — CEFAZOLIN SODIUM-DEXTROSE 2-4 GM/100ML-% IV SOLN
2.0000 g | INTRAVENOUS | Status: AC
  Administered 2021-01-07: 2 g via INTRAVENOUS
  Filled 2021-01-07: qty 100

## 2021-01-07 MED ORDER — CHLORHEXIDINE GLUCONATE 0.12 % MT SOLN
15.0000 mL | Freq: Once | OROMUCOSAL | Status: AC
  Administered 2021-01-07: 15 mL via OROMUCOSAL
  Filled 2021-01-07: qty 15

## 2021-01-07 MED ORDER — SODIUM CHLORIDE 0.9% FLUSH
3.0000 mL | Freq: Two times a day (BID) | INTRAVENOUS | Status: DC
  Administered 2021-01-07: 3 mL via INTRAVENOUS

## 2021-01-07 MED ORDER — FENTANYL CITRATE (PF) 250 MCG/5ML IJ SOLN
INTRAMUSCULAR | Status: DC | PRN
  Administered 2021-01-07 (×3): 50 ug via INTRAVENOUS

## 2021-01-07 MED ORDER — CYCLOBENZAPRINE HCL 10 MG PO TABS: 10.0000 mg | ORAL_TABLET | Freq: Three times a day (TID) | ORAL | Status: DC | PRN

## 2021-01-07 MED ORDER — ONDANSETRON HCL 4 MG PO TABS: 4.0000 mg | ORAL_TABLET | Freq: Four times a day (QID) | ORAL | Status: DC | PRN

## 2021-01-07 MED ORDER — ROCURONIUM BROMIDE 10 MG/ML (PF) SYRINGE
PREFILLED_SYRINGE | INTRAVENOUS | Status: DC | PRN
  Administered 2021-01-07: 60 mg via INTRAVENOUS

## 2021-01-07 MED ORDER — ACETAMINOPHEN 650 MG RE SUPP: 650.0000 mg | RECTAL | Status: DC | PRN

## 2021-01-07 MED ORDER — ARTIFICIAL TEARS OPHTHALMIC OINT
TOPICAL_OINTMENT | OPHTHALMIC | Status: AC
  Filled 2021-01-07: qty 7

## 2021-01-07 MED ORDER — ORAL CARE MOUTH RINSE: 15.0000 mL | Freq: Once | OROMUCOSAL | Status: AC

## 2021-01-07 MED ORDER — PROPOFOL 10 MG/ML IV BOLUS
INTRAVENOUS | Status: DC | PRN
  Administered 2021-01-07: 140 mg via INTRAVENOUS

## 2021-01-07 MED ORDER — ONDANSETRON HCL 4 MG/2ML IJ SOLN: 4.0000 mg | Freq: Four times a day (QID) | INTRAMUSCULAR | Status: DC | PRN

## 2021-01-07 MED ORDER — ACETAMINOPHEN 325 MG PO TABS
650.0000 mg | ORAL_TABLET | ORAL | Status: DC | PRN
  Administered 2021-01-07: 650 mg via ORAL
  Filled 2021-01-07: qty 2

## 2021-01-07 MED ORDER — ROCURONIUM BROMIDE 10 MG/ML (PF) SYRINGE
PREFILLED_SYRINGE | INTRAVENOUS | Status: AC
  Filled 2021-01-07: qty 10

## 2021-01-07 MED ORDER — LIDOCAINE 2% (20 MG/ML) 5 ML SYRINGE
INTRAMUSCULAR | Status: AC
  Filled 2021-01-07: qty 5

## 2021-01-07 MED ORDER — SODIUM CHLORIDE 0.9 % IV SOLN
250.0000 mL | INTRAVENOUS | Status: DC
  Administered 2021-01-07: 250 mL via INTRAVENOUS

## 2021-01-07 MED ORDER — CEFAZOLIN SODIUM-DEXTROSE 2-4 GM/100ML-% IV SOLN
2.0000 g | Freq: Three times a day (TID) | INTRAVENOUS | Status: AC
  Administered 2021-01-07 (×2): 2 g via INTRAVENOUS
  Filled 2021-01-07 (×2): qty 100

## 2021-01-07 MED ORDER — SODIUM CHLORIDE 0.9% FLUSH: 3.0000 mL | INTRAVENOUS | Status: DC | PRN

## 2021-01-07 MED ORDER — PANTOPRAZOLE SODIUM 40 MG IV SOLR: 40.0000 mg | Freq: Every day | INTRAVENOUS | Status: DC

## 2021-01-07 MED ORDER — LACTATED RINGERS IV SOLN: INTRAVENOUS | Status: DC

## 2021-01-07 MED ORDER — ONDANSETRON HCL 4 MG/2ML IJ SOLN
INTRAMUSCULAR | Status: DC | PRN
  Administered 2021-01-07: 4 mg via INTRAVENOUS

## 2021-01-07 MED ORDER — THROMBIN 5000 UNITS EX SOLR
CUTANEOUS | Status: AC
  Filled 2021-01-07: qty 15000

## 2021-01-07 MED ORDER — HYDROMORPHONE HCL 1 MG/ML IJ SOLN: 0.2500 mg | INTRAMUSCULAR | Status: DC | PRN

## 2021-01-07 MED ORDER — SUGAMMADEX SODIUM 200 MG/2ML IV SOLN
INTRAVENOUS | Status: DC | PRN
  Administered 2021-01-07: 200 mg via INTRAVENOUS

## 2021-01-07 MED ORDER — PANTOPRAZOLE SODIUM 40 MG PO TBEC
40.0000 mg | DELAYED_RELEASE_TABLET | Freq: Every day | ORAL | Status: DC
  Administered 2021-01-07: 40 mg via ORAL
  Filled 2021-01-07: qty 1

## 2021-01-07 MED ORDER — ONDANSETRON HCL 4 MG/2ML IJ SOLN
INTRAMUSCULAR | Status: AC
  Filled 2021-01-07: qty 2

## 2021-01-07 MED ORDER — MENTHOL 3 MG MT LOZG: 1.0000 | LOZENGE | OROMUCOSAL | Status: DC | PRN

## 2021-01-07 SURGICAL SUPPLY — 62 items
ADH SKN CLS APL DERMABOND .7 (GAUZE/BANDAGES/DRESSINGS) ×1
APL SKNCLS STERI-STRIP NONHPOA (GAUZE/BANDAGES/DRESSINGS) ×1
BAG COUNTER SPONGE SURGICOUNT (BAG) ×3 IMPLANT
BAG SPNG CNTER NS LX DISP (BAG) ×2
BAND INSRT 18 STRL LF DISP RB (MISCELLANEOUS) ×2
BAND RUBBER #18 3X1/16 STRL (MISCELLANEOUS) ×4 IMPLANT
BASKET BONE COLLECTION (BASKET) ×2 IMPLANT
BENZOIN TINCTURE PRP APPL 2/3 (GAUZE/BANDAGES/DRESSINGS) ×2 IMPLANT
BIT DRILL NEURO 2X3.1 SFT TUCH (MISCELLANEOUS) ×1 IMPLANT
BONE CC-ACS 11X14 X8 6D (Bone Implant) ×2 IMPLANT
BUR MATCHSTICK NEURO 3.0 LAGG (BURR) ×2 IMPLANT
CANISTER SUCT 3000ML PPV (MISCELLANEOUS) ×2 IMPLANT
CARTRIDGE OIL MAESTRO DRILL (MISCELLANEOUS) ×1 IMPLANT
CHIPS BONE CANC-ACS 11X14X8 6D (Bone Implant) IMPLANT
DERMABOND ADVANCED (GAUZE/BANDAGES/DRESSINGS) ×1
DERMABOND ADVANCED .7 DNX12 (GAUZE/BANDAGES/DRESSINGS) IMPLANT
DIFFUSER DRILL AIR PNEUMATIC (MISCELLANEOUS) ×2 IMPLANT
DRAPE C-ARM 42X72 X-RAY (DRAPES) ×4 IMPLANT
DRAPE LAPAROTOMY 100X72 PEDS (DRAPES) ×2 IMPLANT
DRAPE MICROSCOPE LEICA (MISCELLANEOUS) ×2 IMPLANT
DRILL NEURO 2X3.1 SOFT TOUCH (MISCELLANEOUS) ×2
DRSG OPSITE POSTOP 4X6 (GAUZE/BANDAGES/DRESSINGS) ×1 IMPLANT
DURAPREP 6ML APPLICATOR 50/CS (WOUND CARE) ×2 IMPLANT
ELECT COATED BLADE 2.86 ST (ELECTRODE) ×2 IMPLANT
ELECT REM PT RETURN 9FT ADLT (ELECTROSURGICAL) ×2
ELECTRODE REM PT RTRN 9FT ADLT (ELECTROSURGICAL) ×1 IMPLANT
GAUZE 4X4 16PLY ~~LOC~~+RFID DBL (SPONGE) IMPLANT
GAUZE SPONGE 4X4 12PLY STRL (GAUZE/BANDAGES/DRESSINGS) ×2 IMPLANT
GLOVE EXAM NITRILE XL STR (GLOVE) IMPLANT
GLOVE SURG ENC MOIS LTX SZ7 (GLOVE) IMPLANT
GLOVE SURG ENC MOIS LTX SZ8 (GLOVE) ×2 IMPLANT
GLOVE SURG UNDER LTX SZ8.5 (GLOVE) ×2 IMPLANT
GLOVE SURG UNDER POLY LF SZ7 (GLOVE) IMPLANT
GOWN STRL REUS W/ TWL LRG LVL3 (GOWN DISPOSABLE) ×1 IMPLANT
GOWN STRL REUS W/ TWL XL LVL3 (GOWN DISPOSABLE) ×1 IMPLANT
GOWN STRL REUS W/TWL 2XL LVL3 (GOWN DISPOSABLE) ×2 IMPLANT
GOWN STRL REUS W/TWL LRG LVL3 (GOWN DISPOSABLE) ×2
GOWN STRL REUS W/TWL XL LVL3 (GOWN DISPOSABLE) ×2
HALTER HD/CHIN CERV TRACTION D (MISCELLANEOUS) ×2 IMPLANT
HEMOSTAT POWDER KIT SURGIFOAM (HEMOSTASIS) ×2 IMPLANT
KIT BASIN OR (CUSTOM PROCEDURE TRAY) ×2 IMPLANT
KIT TURNOVER KIT B (KITS) ×2 IMPLANT
NDL HYPO 18GX1.5 BLUNT FILL (NEEDLE) ×1 IMPLANT
NDL SPNL 20GX3.5 QUINCKE YW (NEEDLE) ×1 IMPLANT
NEEDLE HYPO 18GX1.5 BLUNT FILL (NEEDLE) IMPLANT
NEEDLE SPNL 20GX3.5 QUINCKE YW (NEEDLE) ×2 IMPLANT
NS IRRIG 1000ML POUR BTL (IV SOLUTION) ×2 IMPLANT
OIL CARTRIDGE MAESTRO DRILL (MISCELLANEOUS) ×2
PACK LAMINECTOMY NEURO (CUSTOM PROCEDURE TRAY) ×2 IMPLANT
PAD ARMBOARD 7.5X6 YLW CONV (MISCELLANEOUS) ×6 IMPLANT
PIN DISTRACTION 14MM (PIN) IMPLANT
PLATE ANT CERV XTEND 1 LV 16 (Plate) ×1 IMPLANT
SCREW VAR 4.2 XD SELF DRILL 16 (Screw) ×4 IMPLANT
SPONGE INTESTINAL PEANUT (DISPOSABLE) ×2 IMPLANT
SPONGE SURGIFOAM ABS GEL SZ50 (HEMOSTASIS) ×2 IMPLANT
STRIP CLOSURE SKIN 1/2X4 (GAUZE/BANDAGES/DRESSINGS) ×2 IMPLANT
SUT VIC AB 3-0 SH 8-18 (SUTURE) ×2 IMPLANT
SUT VICRYL 4-0 PS2 18IN ABS (SUTURE) ×2 IMPLANT
TAPE CLOTH 4X10 WHT NS (GAUZE/BANDAGES/DRESSINGS) ×2 IMPLANT
TOWEL GREEN STERILE (TOWEL DISPOSABLE) ×2 IMPLANT
TOWEL GREEN STERILE FF (TOWEL DISPOSABLE) ×2 IMPLANT
WATER STERILE IRR 1000ML POUR (IV SOLUTION) ×2 IMPLANT

## 2021-01-07 NOTE — Anesthesia Preprocedure Evaluation (Addendum)
Anesthesia Evaluation  Patient identified by MRN, date of birth, ID band Patient awake    Reviewed: Allergy & Precautions, H&P , NPO status , Patient's Chart, lab work & pertinent test results  Airway Mallampati: II  TM Distance: >3 FB Neck ROM: Full    Dental no notable dental hx. (+) Teeth Intact, Dental Advisory Given   Pulmonary neg pulmonary ROS, Current Smoker and Patient abstained from smoking.,    Pulmonary exam normal breath sounds clear to auscultation       Cardiovascular negative cardio ROS   Rhythm:Regular Rate:Normal     Neuro/Psych Anxiety negative neurological ROS     GI/Hepatic negative GI ROS, Neg liver ROS,   Endo/Other  negative endocrine ROS  Renal/GU negative Renal ROS  negative genitourinary   Musculoskeletal   Abdominal   Peds  Hematology negative hematology ROS (+)   Anesthesia Other Findings   Reproductive/Obstetrics negative OB ROS                            Anesthesia Physical Anesthesia Plan  ASA: 2  Anesthesia Plan: General   Post-op Pain Management: Tylenol PO (pre-op)   Induction: Intravenous  PONV Risk Score and Plan: 2 and Ondansetron and Dexamethasone  Airway Management Planned: Oral ETT and Video Laryngoscope Planned  Additional Equipment:   Intra-op Plan:   Post-operative Plan: Extubation in OR  Informed Consent: I have reviewed the patients History and Physical, chart, labs and discussed the procedure including the risks, benefits and alternatives for the proposed anesthesia with the patient or authorized representative who has indicated his/her understanding and acceptance.     Dental advisory given  Plan Discussed with: CRNA  Anesthesia Plan Comments:        Anesthesia Quick Evaluation

## 2021-01-07 NOTE — Anesthesia Postprocedure Evaluation (Signed)
Anesthesia Post Note  Patient: Luvern L Caporaso  Procedure(s) Performed: anterior cervical decompression and fusion cervical six-cervical seven     Patient location during evaluation: PACU Anesthesia Type: General Level of consciousness: awake and alert Pain management: pain level controlled Vital Signs Assessment: post-procedure vital signs reviewed and stable Respiratory status: spontaneous breathing, nonlabored ventilation, respiratory function stable and patient connected to nasal cannula oxygen Cardiovascular status: blood pressure returned to baseline and stable Postop Assessment: no apparent nausea or vomiting Anesthetic complications: no   No notable events documented.  Last Vitals:  Vitals:   01/07/21 1525 01/07/21 1559  BP: (!) 173/86 (!) 168/82  Pulse: (!) 59 72  Resp: 17 20  Temp:  36.6 C  SpO2: 98% 94%    Last Pain:  Vitals:   01/07/21 1525  TempSrc:   PainSc: 0-No pain                 Cecile Hearing

## 2021-01-07 NOTE — Anesthesia Procedure Notes (Signed)
Procedure Name: Intubation Date/Time: 01/07/2021 1:00 PM Performed by: Lowella Dell, CRNA Pre-anesthesia Checklist: Patient identified, Emergency Drugs available, Suction available and Patient being monitored Patient Re-evaluated:Patient Re-evaluated prior to induction Oxygen Delivery Method: Circle System Utilized Preoxygenation: Pre-oxygenation with 100% oxygen Induction Type: IV induction Ventilation: Mask ventilation without difficulty and Oral airway inserted - appropriate to patient size Laryngoscope Size: Mac, Glidescope and 4 Grade View: Grade I Tube type: Oral Tube size: 7.0 mm Number of attempts: 1 Airway Equipment and Method: Stylet and Oral airway Placement Confirmation: ETT inserted through vocal cords under direct vision, positive ETCO2 and breath sounds checked- equal and bilateral Secured at: 21 cm Tube secured with: Tape Dental Injury: Teeth and Oropharynx as per pre-operative assessment  Comments: Intubation by Caryn Bee

## 2021-01-07 NOTE — Op Note (Signed)
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  Postoperative diagnosis: Same  Procedure: Anterior cervical discectomy and fusion at C6-7 utilizing allograft spacer and globus extend plating system  Surgeon: Jillyn Hidden Dymphna Wadley  Assistant: Julien Girt  Anesthesia: General  EBL: Minimal  HPI: 80 year old gentleman progressive worsening difficulty with balance and walking numbness in his hands work-up revealed severe cervical stenosis signal change within his cord at C6-7 due to patient progression of clinical syndrome imaging findings and failed conservative treatment I recommended anterior cervical discectomy and fusion at that level.  I extensively reviewed the risks and benefits of the operation with the patient as well as perioperative course expectations of outcome and alternatives to surgery and he understood and agreed to proceed forward.  Operative procedure: Patient was brought into the OR was [facility] general anesthesia positioned supine the neck in slight extension 5 pounds halter traction.  The right side of his neck was prepped and draped in routine sterile fashion.  Preoperative x-ray localized the appropriate level.  So then a curvilinear incision was made just off the midline to the entry border of the sternocleidomastoid and superficial platysma was dissected out divided longitudinally the avascular plane between the sternomastoid and strap muscle was developed down to the prevertebral fascia and prevertebral fascia was dissected away with Kitners.  Intraoperative x-ray confirmed identification appropriate level.  This was markedly calcified anterior osteophytes were bitten off the Leksell rongeur disc base was further incised and drilled out with a high-speed drill.  Under microscopic lamination further drilling down the posterior osteophytic complex allowed indication posterior longitudinal ligament posterior annulus.  This was all removed in  piecemeal fashion exposing the thecal sac there is marked stenosis on thecal sac from primarily uncinate vertically but also significant osteophytes coming off the posterior aspect of each endplate.  All this was aggressively under Bitton decompressing central canal marching laterally both C7 pedicles were identified both C7 nerve roots were decompressed flush with the pedicle.  After adequate discectomy and decompression achieved both centrally and foraminally I selected an 8 mm lordotic implant inserted this to 3 mm deep to the anterior vertebral line and a 11mm globus extend plate all screws had excellent purchase locking mechanism was engaged medical hemostasis was maintained the wound was copiously irrigated and the wound was closed in layers with interrupted Vicryl in the platysma and a running 4 subcuticular Dermabond benzoin Steri-Strips and a sterile dressing was applied patient recovery in stable condition.  At the end the case all needle counts and sponge counts were correct.

## 2021-01-07 NOTE — H&P (Signed)
Donald Whitaker is an 80 y.o. male.   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  I have gone over the risks and benefits of the operation with him as well as perioperative course expectations of outcome and alternatives of surgery and he understands and agrees to proceed forward.  Past Medical History:  Diagnosis Date   Anxiety    Bilateral sensorineural hearing loss    CKD (chronic kidney disease)     History reviewed. No pertinent surgical history.  History reviewed. No pertinent family history. Social History:  reports that he has been smoking cigars. He has never used smokeless tobacco. He reports current alcohol use of about 1.0 standard drink per week. He reports that he does not use drugs.  Allergies: No Known Allergies  No medications prior to admission.    No results found for this or any previous visit (from the past 48 hour(s)). No results found.  Review of Systems  Neurological:  Positive for weakness and numbness.   Blood pressure (!) 186/84, pulse 64, temperature 97.9 F (36.6 C), temperature source Oral, resp. rate 17, SpO2 99 %. Physical Exam HENT:     Head: Normocephalic.     Right Ear: Tympanic membrane normal.     Nose: Nose normal.     Mouth/Throat:     Mouth: Mucous membranes are moist.  Cardiovascular:     Rate and Rhythm: Normal rate.  Pulmonary:     Effort: Pulmonary effort is normal.  Abdominal:     General: Abdomen is flat.  Musculoskeletal:        General: Normal range of motion.  Skin:    General: Skin is warm.  Neurological:     General: No focal deficit present.      Mental Status: He is alert.     Comments: Strength is 5-5 deltoid, bicep, trace weakness in triceps 4+ out of 5 hand intrinsics are also slightly weak at 4+ out of 5 reflexes are brisk     Assessment/Plan 80 year old presents for an ACDF C6-7  Mariam Dollar, MD 01/07/2021, 12:02 PM

## 2021-01-07 NOTE — Progress Notes (Signed)
Orthopedic Tech Progress Note Patient Details:  Donald Whitaker 06/11/1940 [id number]  Ortho Devices Type of Ortho Device: Soft collar Ortho Device/Splint Interventions: Ordered, Application   Post Interventions Patient Tolerated: Well  [name] 01/07/2021, 4:02 PM

## 2021-01-07 NOTE — Transfer of Care (Signed)
Immediate Anesthesia Transfer of Care Note  Patient: Donald Whitaker  Procedure(s) Performed: anterior cervical decompression and fusion cervical six-cervical seven  Patient Location: PACU  Anesthesia Type:General  Level of Consciousness: awake and patient cooperative  Airway & Oxygen Therapy: Patient Spontanous Breathing and Patient connected to face mask oxygen  Post-op Assessment: Report given to RN, Post -op Vital signs reviewed and stable and Patient moving all extremities X 4  Post vital signs: Reviewed and stable  Last Vitals:  Vitals Value Taken Time  BP    Temp    Pulse 79   Resp 18   SpO2 97     Last Pain:  Vitals:   01/07/21 1145  TempSrc:   PainSc: 0-No pain         Complications: No notable events documented.

## 2021-01-08 DIAGNOSIS — M5000 Cervical disc disorder with myelopathy, unspecified cervical region: Secondary | ICD-10-CM | POA: Diagnosis not present

## 2021-01-08 DIAGNOSIS — M2578 Osteophyte, vertebrae: Secondary | ICD-10-CM | POA: Diagnosis not present

## 2021-01-08 DIAGNOSIS — F17290 Nicotine dependence, other tobacco product, uncomplicated: Secondary | ICD-10-CM | POA: Diagnosis not present

## 2021-01-08 DIAGNOSIS — M4802 Spinal stenosis, cervical region: Secondary | ICD-10-CM | POA: Diagnosis not present

## 2021-01-08 DIAGNOSIS — M4712 Other spondylosis with myelopathy, cervical region: Secondary | ICD-10-CM | POA: Diagnosis not present

## 2021-01-08 DIAGNOSIS — F419 Anxiety disorder, unspecified: Secondary | ICD-10-CM | POA: Diagnosis not present

## 2021-01-08 MED ORDER — HYDROCODONE-ACETAMINOPHEN 5-325 MG PO TABS: 2.0000 | ORAL_TABLET | ORAL | 0 refills | Status: DC | PRN

## 2021-01-08 NOTE — Evaluation (Signed)
Physical Therapy Evaluation Patient Details Name: Donald Whitaker MRN: [id number] DOB: 05/18/1940 Today's Date: 01/08/2021  History of Present Illness  80 yo M s/p ACDF C6-7.  PMH includes: Anxiety, Bilateral sensorineural hearing loss, CKD.  Clinical Impression  Pt presents to PT with deficits in balance, gait, coordination, cognition, and safety awareness. Pt with 2 anterior losses of balance due to LE ataxia and excessive forward momentum when ambulating during this session (pt corrects with stepping strategy). PT provides education on reduction of gait speed and more caution when mobilizing to aide in reducing falls risk. PT provides recommendation for outpatient PT to aide in further improving balance and gait quality.       Recommendations for follow up therapy are one component of a multi-disciplinary discharge planning process, led by the attending physician.  Recommendations may be updated based on patient status, additional functional criteria and insurance authorization.  Follow Up Recommendations Outpatient PT (gait and balance)    Assistance Recommended at Discharge Intermittent Supervision/Assistance  Functional Status Assessment Patient has had a recent decline in their functional status and demonstrates the ability to make significant improvements in function in a reasonable and predictable amount of time.  Equipment Recommendations   (discussed recommendation for cane, family will consider after discharge)    Recommendations for Other Services       Precautions / Restrictions Precautions Precautions: Cervical Precaution Booklet Issued: Yes (comment) Precaution Comments: Reviewed Required Braces or Orthoses: Cervical Brace Cervical Brace: Soft collar Restrictions Weight Bearing Restrictions: No      Mobility  Bed Mobility Overal bed mobility: Needs Assistance Bed Mobility: Sidelying to Sit;Sit to Sidelying   Sidelying to sit: Supervision     Sit to  sidelying: Supervision      Transfers Overall transfer level: Independent Equipment used: None Transfers: Sit to/from Stand Sit to Stand: Independent                Ambulation/Gait Ambulation/Gait assistance: Supervision Gait Distance (Feet): 350 Feet Assistive device: None;Straight cane (straight cane for ~30') Gait Pattern/deviations: Step-through pattern Gait velocity: rapid Gait velocity interpretation: >2.62 ft/sec, indicative of community ambulatory   General Gait Details: pt with steady step-through gait for most of ambulation, does experience two instances of forward trunk lean and increase in gait speed with anterior loss of balance, able to correct with stepping strategy.  Stairs Stairs: Yes Stairs assistance: Supervision Stair Management: One rail Right;Alternating pattern;Forwards Number of Stairs: 10    Wheelchair Mobility    Modified Rankin (Stroke Patients Only)       Balance Overall balance assessment: Needs assistance Sitting-balance support: No upper extremity supported;Feet supported Sitting balance-Leahy Scale: Good Sitting balance - Comments: 2 loss of balance to the L seated while trying to place sock/shoe Postural control: Left lateral lean Standing balance support: During functional activity;No upper extremity supported Standing balance-Leahy Scale: Fair Standing balance comment: unsteady with multiple LOB with occasional Min Guard.                             Pertinent Vitals/Pain Pain Assessment: No/denies pain    Home Living Family/patient expects to be discharged to:: Private residence Living Arrangements: Spouse/significant other Available Help at Discharge: Family;Available 24 hours/day Type of Home: House Home Access: Stairs to enter     Alternate Level Stairs-Number of Steps: 18 total Home Layout: Two level Home Equipment: None;Shower seat - built in Additional Comments: spouse has a RW  Prior Function  Prior Level of Function : Independent/Modified Independent;Working/employed;Driving             Mobility Comments: Walks without an AD.  Endorses 6 falls over past few months. ADLs Comments: No assist with ADL/IADL.  Continues to drive, and works part time as a history professor.     Hand Dominance   Dominant Hand: Right    Extremity/Trunk Assessment   Upper Extremity Assessment Upper Extremity Assessment: Overall WFL for tasks assessed    Lower Extremity Assessment Lower Extremity Assessment: Overall WFL for tasks assessed    Cervical / Trunk Assessment Cervical / Trunk Assessment: Neck Surgery  Communication   Communication: No difficulties  Cognition Arousal/Alertness: Awake/alert Behavior During Therapy: Impulsive Overall Cognitive Status: History of cognitive impairments - at baseline                                 General Comments: poor short term memory, reduced safety awareness        General Comments General comments (skin integrity, edema, etc.): VSS on RA    Exercises     Assessment/Plan    PT Assessment Patient needs continued PT services  PT Problem List Decreased balance;Decreased mobility;Decreased coordination;Decreased cognition;Decreased knowledge of precautions;Decreased knowledge of use of DME       PT Treatment Interventions Gait training;DME instruction;Stair training;Functional mobility training;Therapeutic activities;Therapeutic exercise;Balance training;Neuromuscular re-education;Patient/family education    PT Goals (Current goals can be found in the Care Plan section)  Acute Rehab PT Goals Patient Stated Goal: to go home PT Goal Formulation: With patient/family Time For Goal Achievement: 01/13/21 Potential to Achieve Goals: Good Additional Goals Additional Goal #1: Pt will score >19/24 on DGI to indicate a reduce risk for falls    Frequency Min 5X/week   Barriers to discharge        Co-evaluation                AM-PAC PT "6 Clicks" Mobility  Outcome Measure Help needed turning from your back to your side while in a flat bed without using bedrails?: None Help needed moving from lying on your back to sitting on the side of a flat bed without using bedrails?: None Help needed moving to and from a bed to a chair (including a wheelchair)?: None Help needed standing up from a chair using your arms (e.g., wheelchair or bedside chair)?: None Help needed to walk in hospital room?: A Little Help needed climbing 3-5 steps with a railing? : A Little 6 Click Score: 22    End of Session Equipment Utilized During Treatment: Cervical collar Activity Tolerance: Patient tolerated treatment well Patient left: in chair;with call bell/phone within reach;with family/visitor present Nurse Communication: Mobility status PT Visit Diagnosis: Other abnormalities of gait and mobility (R26.89);History of falling (Z91.81);Unsteadiness on feet (R26.81)    Time: 4128-7867 PT Time Calculation (min) (ACUTE ONLY): 25 min   Charges:   PT Evaluation $PT Eval Low Complexity: 1 Low          Arlyss Gandy, PT, DPT Acute Rehabilitation Pager: [phone] Office [phone]   Arlyss Gandy 01/08/2021, 9:51 AM

## 2021-01-08 NOTE — Progress Notes (Signed)
Patient alert and oriented, mae's well, voiding adequate amount of urine, swallowing without difficulty, no c/o pain at time of discharge. Patient discharged home with family. Script and discharged instructions given to patient. Patient and family stated understanding of instructions given. Patient has an appointment with Dr. Cram 

## 2021-01-08 NOTE — Discharge Instructions (Signed)
Wound Care Keep the incision clean and dry remove the outer dressing in 2 days, leave the Steri-Strips intact.  Do not put any creams, lotions, or ointments on incision. Leave steri-strips on neck.  They will fall off by themselves.  Activity Walk each and every day, increasing distance each day. No lifting greater than 5 lbs.  Avoid excessive neck motion. No lifting no bending no twisting no driving or riding a car unless coming back and forth to see me. Wear neck brace at all times except when showering.   Diet Resume your normal diet.    Call Your Doctor If Any of These Occur Redness, drainage, or swelling at the wound.  Temperature greater than 101 degrees. Severe pain not relieved by pain medication. Incision starts to come apart.  Follow Up Appt Call today for appointment in 1-2 weeks ([phone]) or for problems.  If you have any hardware placed in your spine, you will need an x-ray before your appointment.   

## 2021-01-08 NOTE — Discharge Summary (Signed)
Physician Discharge Summary  Patient ID: Donald Whitaker MRN: [id number] DOB/AGE: Jun 11, 1940 80 y.o.  Admit date: 01/07/2021 Discharge date: 01/08/2021  Admission Diagnoses: Cervical herniated disc with myelopathy  Discharge Diagnoses: The same Principal Problem:   HNP (herniated nucleus pulposus) with myelopathy, cervical   Discharged Condition: good  Hospital Course: Dr. Wynetta Emery performed an anterior cervical discectomy, fusion and plating on the patient on 01/07/2021.  The patient's postoperative course was unremarkable.  On postoperative day 1 the patient requested discharge to home.  The patient, and his son, were given verbal and written discharge instructions.  All her questions were answered.  Consults: PT, OT, care management Significant Diagnostic Studies: None Treatments: Anterior cervicectomy fusion and plating. Discharge Exam: Blood pressure 128/78, pulse 86, temperature 98.4 F (36.9 C), temperature source Oral, resp. rate 16, SpO2 94 %. The patient is alert and pleasant.  His dressing is clean dry.  There is no hematoma or shift.  Disposition: Home  Discharge Instructions     Call MD for:  difficulty breathing, headache or visual disturbances   Complete by: As directed    Call MD for:  extreme fatigue   Complete by: As directed    Call MD for:  hives   Complete by: As directed    Call MD for:  persistant dizziness or light-headedness   Complete by: As directed    Call MD for:  persistant nausea and vomiting   Complete by: As directed    Call MD for:  redness, tenderness, or signs of infection (pain, swelling, redness, odor or green/yellow discharge around incision site)   Complete by: As directed    Call MD for:  severe uncontrolled pain   Complete by: As directed    Call MD for:  temperature >100.4   Complete by: As directed    Diet - low sodium heart healthy   Complete by: As directed    Discharge instructions   Complete by: As directed    Call  [phone] for a followup appointment. Take a stool softener while you are using pain medications.   Driving Restrictions   Complete by: As directed    Do not drive for 2 weeks.   Increase activity slowly   Complete by: As directed    Lifting restrictions   Complete by: As directed    Do not lift more than 5 pounds. No excessive bending or twisting.   May shower / Bathe   Complete by: As directed    Remove the dressing for 3 days after surgery.  You may shower, but leave the incision alone.   Remove dressing in 48 hours   Complete by: As directed       Allergies as of 01/08/2021   No Known Allergies      Medication List     TAKE these medications    HYDROcodone-acetaminophen 5-325 MG tablet Commonly known as: NORCO/VICODIN Take 2 tablets by mouth every 4 (four) hours as needed for severe pain ((score 7 to 10)).         Signed: Cristi Loron 01/08/2021, 8:39 AM

## 2021-01-08 NOTE — Evaluation (Signed)
Occupational Therapy Evaluation Patient Details Name: Donald Whitaker MRN: [id number] DOB: 17-Sep-1940 Today's Date: 01/08/2021   History of Present Illness 80 yo M s/p ACDF C6-7.  PMH includes: Anxiety, Bilateral sensorineural hearing loss, CKD.   Clinical Impression   Patient admitted for the procedure above.  PTA he lives at home with his spouse.  The patient's spouse uses a RW, and the patient is the primary caregiver.  He does not need to provide physical assist, just supportive services.  He continues to drive and works PT from home.  Primary deficit is balance and decreased safety.  Currently he is able to perform all in room mobility/toileting and ADL from sit/stand level, but is very unsteady, demonstrates decreased safety, and question ST memory deficits.  All precautions reviewed with the patient and his son.  The son, and his sister, live in town, and will be assisting as needed for the next few weeks.  All questions answered.  PT eval pending.     Recommendations for follow up therapy are one component of a multi-disciplinary discharge planning process, led by the attending physician.  Recommendations may be updated based on patient status, additional functional criteria and insurance authorization.   Follow Up Recommendations  No OT follow up    Assistance Recommended at Discharge    Functional Status Assessment  Patient has had a recent decline in their functional status and demonstrates the ability to make significant improvements in function in a reasonable and predictable amount of time.  Equipment Recommendations  Tub/shower seat    Recommendations for Other Services       Precautions / Restrictions Precautions Precautions: Cervical Precaution Booklet Issued: Yes (comment) Precaution Comments: Reviewed Required Braces or Orthoses: Cervical Brace Cervical Brace: Soft collar;For comfort Restrictions Weight Bearing Restrictions: No      Mobility Bed  Mobility Overal bed mobility: Needs Assistance Bed Mobility: Sidelying to Sit;Sit to Sidelying   Sidelying to sit: Supervision     Sit to sidelying: Supervision      Transfers Overall transfer level: Needs assistance   Transfers: Sit to/from Stand Sit to Stand: Supervision                  Balance Overall balance assessment: Needs assistance Sitting-balance support: Feet supported Sitting balance-Leahy Scale: Fair Sitting balance - Comments: 2 loss of balance to the L seated while trying to place sock/shoe Postural control: Left lateral lean Standing balance support: No upper extremity supported Standing balance-Leahy Scale: Poor Standing balance comment: unsteady with multiple LOB with occasional Min Guard.                           ADL either performed or assessed with clinical judgement   ADL Overall ADL's : At baseline                                       General ADL Comments: Generalized supervision with occasional Min Guard for balance support.     Vision Baseline Vision/History: 1 Wears glasses Patient Visual Report: No change from baseline       Perception Perception Perception: Not tested   Praxis Praxis Praxis: Not tested    Pertinent Vitals/Pain Pain Assessment: No/denies pain     Hand Dominance Right   Extremity/Trunk Assessment Upper Extremity Assessment Upper Extremity Assessment: Overall WFL for tasks assessed   Lower Extremity  Assessment Lower Extremity Assessment: Defer to PT evaluation   Cervical / Trunk Assessment Cervical / Trunk Assessment: Neck Surgery   Communication Communication Communication: No difficulties   Cognition Arousal/Alertness: Awake/alert Behavior During Therapy: WFL for tasks assessed/performed Overall Cognitive Status: History of cognitive impairments - at baseline                                 General Comments: Impulsive, decreased safety awareness and ST  memory deficts noted.     General Comments   ? Outpatient PT for balance    Exercises     Shoulder Instructions      Home Living Family/patient expects to be discharged to:: Private residence Living Arrangements: Spouse/significant other Available Help at Discharge: Family;Available 24 hours/day Type of Home: House Home Access: Stairs to enter     Home Layout: Two level Alternate Level Stairs-Number of Steps: 18 total   Bathroom Shower/Tub: Producer, television/film/video: Standard Bathroom Accessibility: Yes How Accessible: Accessible via walker Home Equipment: None;Shower seat - built in   Additional Comments: spouse has a RW      Prior Functioning/Environment Prior Level of Function : Independent/Modified Independent;Working/employed;Driving             Mobility Comments: Walks without an AD.  Endorses 6 falls. ADLs Comments: No assist with ADL/IADL.  Continues to drive, and works PT as a history professor.        OT Problem List: Impaired balance (sitting and/or standing);Decreased safety awareness;Decreased cognition      OT Treatment/Interventions:      OT Goals(Current goals can be found in the care plan section) Acute Rehab OT Goals Patient Stated Goal: Hoping to return home OT Goal Formulation: With patient Time For Goal Achievement: 01/10/21 Potential to Achieve Goals: Good  OT Frequency:     Barriers to D/C:  Safety and balance          Co-evaluation              AM-PAC OT "6 Clicks" Daily Activity     Outcome Measure Help from another person eating meals?: None Help from another person taking care of personal grooming?: None Help from another person toileting, which includes using toliet, bedpan, or urinal?: A Little Help from another person bathing (including washing, rinsing, drying)?: A Little Help from another person to put on and taking off regular upper body clothing?: None Help from another person to put on and taking off  regular lower body clothing?: A Little 6 Click Score: 21   End of Session Equipment Utilized During Treatment: Cervical collar Nurse Communication: Mobility status  Activity Tolerance: Patient tolerated treatment well Patient left: in chair;with call bell/phone within reach;with family/visitor present  OT Visit Diagnosis: Unsteadiness on feet (R26.81);History of falling (Z91.81)                Time: 3212-2482 OT Time Calculation (min): 25 min Charges:  OT General Charges $OT Visit: 1 Visit OT Evaluation $OT Eval Moderate Complexity: 1 Mod OT Treatments $Self Care/Home Management : 8-22 mins  01/08/2021  RP, OTR/L  Acute Rehabilitation Services  Office:  [phone]   Suzanna Obey 01/08/2021, 8:45 AM

## 2021-01-10 ENCOUNTER — Encounter (HOSPITAL_COMMUNITY): Payer: Self-pay | Admitting: Neurosurgery

## 2021-02-04 NOTE — Progress Notes (Signed)
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  Patient and his wife are concerned that he hasn't improved since the surgery.  I explained that it may take time.   He has a follow up appointment with neurosurgery in a couple of weeks.  I advised that he explain that he would feel more comfortable if he receives a round of physical therapy.  If the neurosurgeon will not order it, and it is safe from a postsurgical standpoint, I will order PT for him.     Subjective:  Donald Whitaker is an 80 year old right-handed male with CKD and bilateral sensorineural hearing loss who follows up for gait instability and lower extremity weakness.  He is accompanied by his wife who provides additional history.  UPDATE: MRI of brain with and without contrast on 11/13/2020 personally reviewed moderate chronic small vessel ischemic changes as well as mild ventricular enlargement which again appears to be due to moderate atrophy.  MRI of cervical spine on 12/18/2020 personally reviewed showed herniated nucleus pulposus causing severe spinal canal stenosis at C6-C7 causing compressive myelopathy with increased T2 signal within the spinal cord.  She was referred to neurosurgery and underwent ACDF and plating on 01/07/2021.  He still feels weakness in the legs.  He doesn't notice improvement in gait but not any worse either.  Using a cane.  The neurosurgeon did not think he needed physical therapy.     HISTORY: Around April 2022, he had a fall and couldn't get up on his own.  Then around June, he began noticing unsteady gait.  He says he feels weak in the legs.  When he walks he feels like he is going to tip over.  He has had 7 falls over the past 3 months.  He has been athletic and regularly walked and used the gym  with weights.  He used to walk 60 minutes, now only 10 minutes.  No back pain, pain in legs or numbness in legs and feet.  He is able to walk up and down the stairs holding the bannister but without difficulty.  Has history of cervical spondylosis but nothing significant and denies neck pain.  Denies back pain, leg pain or numbness in the feet.  Denies involvement of the upper extremities.  Denies dizziness, diplopia or dysphagia.  No bowel or bladder dysfunction.  Labs from August include CPK 44.  CT head without contrast on 09/25/2020 personally reviewed demonstrated brain atrophy and chronic small vessel ischemic changes but no acute abnormality or out of proportion ventriculomegaly.    PAST MEDICAL HISTORY: Past Medical History:  Diagnosis Date   Anxiety    Bilateral sensorineural hearing loss    CKD (chronic kidney disease)     MEDICATIONS: Current Outpatient Medications on File Prior to Visit  Medication Sig Dispense Refill   HYDROcodone-acetaminophen (NORCO/VICODIN) 5-325 MG tablet Take 2 tablets by mouth every 4 (four) hours as needed for severe pain ((score 7 to 10)). 30 tablet 0   No current facility-administered medications on file prior to visit.    ALLERGIES: No Known Allergies  FAMILY HISTORY: No family history on file.    Objective:  Blood pressure (!) 143/83, pulse 72, height 6' (1.829 m), weight 196 lb 3.2 oz (89 kg), SpO2 96 %. General: No acute distress.  Patient appears  well-groomed.   Head:  Normocephalic/atraumatic Eyes:  Fundi examined but not visualized Neck: supple, no paraspinal tenderness, full range of motion Heart:  Regular rate and rhythm Lungs:  Clear to auscultation bilaterally Back: No paraspinal tenderness Neurological Exam: alert and oriented to person, place, and time.  Speech fluent and not dysarthric, language intact.  CN II-XII intact. Bulk and tone normal, muscle strength 5/5 throughout.  Sensation to pinprick reduced in first toe of left foot,  otherwise intact.  Vibratory sensation intact.  Deep tendon reflexes 2+ throughout, toes downgoing.  Finger to nose testing intact.  Upright posture, broad-based gait.  Right leg appears to occasionally slightly drag.  Romberg negative.   Shon Millet, DO  CC: Georgann Housekeeper, MD

## 2021-02-08 ENCOUNTER — Ambulatory Visit: Payer: Medicare PPO | Admitting: Neurology

## 2021-02-08 ENCOUNTER — Encounter: Payer: Self-pay | Admitting: Neurology

## 2021-02-08 ENCOUNTER — Other Ambulatory Visit: Payer: Self-pay

## 2021-02-08 VITALS — BP 143/83 | HR 72 | Ht 72.0 in | Wt 196.2 lb

## 2021-02-08 DIAGNOSIS — R270 Ataxia, unspecified: Secondary | ICD-10-CM | POA: Diagnosis not present

## 2021-02-08 DIAGNOSIS — M5000 Cervical disc disorder with myelopathy, unspecified cervical region: Secondary | ICD-10-CM | POA: Diagnosis not present

## 2021-02-08 NOTE — Patient Instructions (Addendum)
When you see Dr. Wynetta Emery, let him know that you would feel more comfortable having a round of physical therapy.  If he still doesn't feel like that it is necessary, but at this point it would be safe to have physical therapy, contact me and I will order it.

## 2021-02-22 DIAGNOSIS — R030 Elevated blood-pressure reading, without diagnosis of hypertension: Secondary | ICD-10-CM | POA: Diagnosis not present

## 2021-02-22 DIAGNOSIS — G959 Disease of spinal cord, unspecified: Secondary | ICD-10-CM | POA: Diagnosis not present

## 2021-02-22 DIAGNOSIS — Z6826 Body mass index (BMI) 26.0-26.9, adult: Secondary | ICD-10-CM | POA: Diagnosis not present

## 2021-02-28 DIAGNOSIS — H903 Sensorineural hearing loss, bilateral: Secondary | ICD-10-CM | POA: Diagnosis not present

## 2021-03-24 DIAGNOSIS — Z6826 Body mass index (BMI) 26.0-26.9, adult: Secondary | ICD-10-CM | POA: Diagnosis not present

## 2021-03-24 DIAGNOSIS — R030 Elevated blood-pressure reading, without diagnosis of hypertension: Secondary | ICD-10-CM | POA: Diagnosis not present

## 2021-03-24 DIAGNOSIS — G959 Disease of spinal cord, unspecified: Secondary | ICD-10-CM | POA: Diagnosis not present

## 2021-03-24 DIAGNOSIS — R270 Ataxia, unspecified: Secondary | ICD-10-CM | POA: Diagnosis not present

## 2021-03-25 ENCOUNTER — Other Ambulatory Visit: Payer: Self-pay | Admitting: Neurosurgery

## 2021-03-25 DIAGNOSIS — R270 Ataxia, unspecified: Secondary | ICD-10-CM

## 2021-04-11 ENCOUNTER — Other Ambulatory Visit: Payer: Self-pay

## 2021-04-11 ENCOUNTER — Ambulatory Visit
Admission: RE | Admit: 2021-04-11 | Discharge: 2021-04-11 | Disposition: A | Discharge status: Home / Self Care | Payer: Medicare PPO | Source: Ambulatory Visit | Attending: Neurosurgery | Admitting: Neurosurgery

## 2021-04-11 DIAGNOSIS — R270 Ataxia, unspecified: Secondary | ICD-10-CM

## 2021-04-11 DIAGNOSIS — R29898 Other symptoms and signs involving the musculoskeletal system: Secondary | ICD-10-CM | POA: Diagnosis not present

## 2021-04-11 DIAGNOSIS — M5134 Other intervertebral disc degeneration, thoracic region: Secondary | ICD-10-CM | POA: Diagnosis not present

## 2021-04-11 DIAGNOSIS — M2578 Osteophyte, vertebrae: Secondary | ICD-10-CM | POA: Diagnosis not present

## 2021-04-11 DIAGNOSIS — M4802 Spinal stenosis, cervical region: Secondary | ICD-10-CM | POA: Diagnosis not present

## 2021-04-20 DIAGNOSIS — R030 Elevated blood-pressure reading, without diagnosis of hypertension: Secondary | ICD-10-CM | POA: Diagnosis not present

## 2021-05-23 DIAGNOSIS — R030 Elevated blood-pressure reading, without diagnosis of hypertension: Secondary | ICD-10-CM | POA: Diagnosis not present

## 2021-05-23 DIAGNOSIS — R21 Rash and other nonspecific skin eruption: Secondary | ICD-10-CM | POA: Diagnosis not present

## 2021-06-08 DIAGNOSIS — N182 Chronic kidney disease, stage 2 (mild): Secondary | ICD-10-CM | POA: Diagnosis not present

## 2021-06-08 DIAGNOSIS — E038 Other specified hypothyroidism: Secondary | ICD-10-CM | POA: Diagnosis not present

## 2021-06-08 DIAGNOSIS — R29898 Other symptoms and signs involving the musculoskeletal system: Secondary | ICD-10-CM | POA: Diagnosis not present

## 2021-06-08 DIAGNOSIS — I700 Atherosclerosis of aorta: Secondary | ICD-10-CM | POA: Diagnosis not present

## 2021-06-08 DIAGNOSIS — R269 Unspecified abnormalities of gait and mobility: Secondary | ICD-10-CM | POA: Diagnosis not present

## 2021-06-08 DIAGNOSIS — E782 Mixed hyperlipidemia: Secondary | ICD-10-CM | POA: Diagnosis not present

## 2021-06-08 DIAGNOSIS — R7301 Impaired fasting glucose: Secondary | ICD-10-CM | POA: Diagnosis not present

## 2021-06-08 DIAGNOSIS — Z0000 Encounter for general adult medical examination without abnormal findings: Secondary | ICD-10-CM | POA: Diagnosis not present

## 2021-06-08 DIAGNOSIS — R972 Elevated prostate specific antigen [PSA]: Secondary | ICD-10-CM | POA: Diagnosis not present

## 2021-07-18 DIAGNOSIS — L218 Other seborrheic dermatitis: Secondary | ICD-10-CM | POA: Diagnosis not present

## 2021-07-18 DIAGNOSIS — L2089 Other atopic dermatitis: Secondary | ICD-10-CM | POA: Diagnosis not present

## 2021-07-19 DIAGNOSIS — Z6826 Body mass index (BMI) 26.0-26.9, adult: Secondary | ICD-10-CM | POA: Diagnosis not present

## 2021-07-19 DIAGNOSIS — G912 (Idiopathic) normal pressure hydrocephalus: Secondary | ICD-10-CM | POA: Diagnosis not present

## 2021-07-27 DIAGNOSIS — R293 Abnormal posture: Secondary | ICD-10-CM | POA: Diagnosis not present

## 2021-07-27 DIAGNOSIS — M6281 Muscle weakness (generalized): Secondary | ICD-10-CM | POA: Diagnosis not present

## 2021-07-27 DIAGNOSIS — R2689 Other abnormalities of gait and mobility: Secondary | ICD-10-CM | POA: Diagnosis not present

## 2021-08-01 ENCOUNTER — Telehealth: Payer: Self-pay | Admitting: Neurology

## 2021-08-04 NOTE — Telephone Encounter (Signed)
Patient's wife called and said she has reached out to Dr. Lonie Peak office two days ago and requested records to be faxed. She said he is right next door to this office but she will call them back again.

## 2021-08-04 NOTE — Telephone Encounter (Signed)
Telephone call to [facility] please fax over last ov notes and Surgery notes.   Will fax over records to [place] now.

## 2021-08-04 NOTE — Telephone Encounter (Signed)
Patient wife called and said that she Dr Wynetta Emery office and they would have to sign a paper for them to release the records to [place]. But she can not drive and it is not safe for patient to drive. She was told that Everlena Cooper could call Medical records at [facility] office said that if Dr Everlena Cooper called Wynetta Emery office they will release the records to our office without a sign release. She has tried repeatedly to get this done and getting no where with this

## 2021-08-09 NOTE — Progress Notes (Unsigned)
   NEUROLOGY FOLLOW UP OFFICE NOTE  Donald Whitaker [id number]  Assessment/Plan:   Gait instability - no improvement since cervical decompression and physical therapy.  Given no improvement, concern that he may have normal pressure hydrocephalus.   ***     Subjective:  Donald Whitaker is an 81 year old right-handed male with CKD and bilateral sensorineural hearing loss who follows up for gait instability and lower extremity weakness.  He is accompanied by his wife who provides additional history.   UPDATE: Underwent physical therapy.  Despite cervical decompression and physical therapy, no improvement in his balance.  Therefore, his neurosurgeon is concerned that the  ventriculomegaly may represent NPH.     HISTORY: Around April 2022, he had a fall and couldn't get up on his own.  Then around June, he began noticing unsteady gait.  He says he feels weak in the legs.  When he walks he feels like he is going to tip over.  He has had 7 falls over the past 3 months.  He has been athletic and regularly walked and used the gym with weights.  He used to walk 60 minutes, now only 10 minutes.  No back pain, pain in legs or numbness in legs and feet.  He is able to walk up and down the stairs holding the bannister but without difficulty.  Has history of cervical spondylosis but nothing significant and denies neck pain.  Denies back pain, leg pain or numbness in the feet.  Denies involvement of the upper extremities.  Denies dizziness, diplopia or dysphagia.  No bowel or bladder dysfunction.  Labs from August include CPK 44.  CT head without contrast on 09/25/2020 personally reviewed demonstrated brain atrophy and chronic small vessel ischemic changes but no acute abnormality or out of proportion ventriculomegaly.  MRI of brain with and without contrast on 11/13/2020 personally reviewed moderate chronic small vessel ischemic changes as well as mild ventricular enlargement which again appears to be due  to moderate atrophy.  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  She was referred to neurosurgery and underwent ACDF and plating on 01/07/2021.   PAST MEDICAL HISTORY: Past Medical History:  Diagnosis Date   Anxiety    Bilateral sensorineural hearing loss    CKD (chronic kidney disease)     MEDICATIONS: No current outpatient medications on file prior to visit.   No current facility-administered medications on file prior to visit.    ALLERGIES: No Known Allergies  FAMILY HISTORY: No family history on file.    Objective:  *** General: No acute distress.  Patient appears well-groomed.   Head:  Normocephalic/atraumatic Eyes:  Fundi examined but not visualized Neck: supple, no paraspinal tenderness, full range of motion Heart:  Regular rate and rhythm Lungs:  Clear to auscultation bilaterally Back: No paraspinal tenderness Neurological Exam: alert and oriented to person, place, and time.  Speech fluent and not dysarthric, language intact.  CN II-XII intact. Bulk and tone normal, muscle strength 5/5 throughout.  Sensation to light touch intact.  Deep tendon reflexes 2+ throughout, toes downgoing.  Finger to nose testing intact.  Gait normal, Romberg negative.   Shon Millet, DO  CC: Georgann Housekeeper, MD

## 2021-08-10 ENCOUNTER — Encounter: Payer: Self-pay | Admitting: Neurology

## 2021-08-10 ENCOUNTER — Ambulatory Visit: Payer: Medicare PPO | Admitting: Neurology

## 2021-08-10 VITALS — BP 161/94 | HR 68 | Ht 75.0 in | Wt 192.0 lb

## 2021-08-10 DIAGNOSIS — R030 Elevated blood-pressure reading, without diagnosis of hypertension: Secondary | ICD-10-CM | POA: Diagnosis not present

## 2021-08-10 DIAGNOSIS — G912 (Idiopathic) normal pressure hydrocephalus: Secondary | ICD-10-CM

## 2021-08-10 DIAGNOSIS — R296 Repeated falls: Secondary | ICD-10-CM | POA: Diagnosis not present

## 2021-08-10 NOTE — Patient Instructions (Signed)
Will schedule to have a spinal tap performed in the hospital with evaluation by physical therapist before and after.  If findings seem consistent with normal pressure hydrocephalus, I would refer you to neurosurgery at [facility] for consideration of a shunt placement.  If results are not consistent with normal pressure hydrocephalus, we will pursue other testing and causes. Follow up after testing.

## 2021-08-12 NOTE — Addendum Note (Signed)
Addended by: Lenise Herald on: 08/12/2021 09:25 AM   Modules accepted: Orders

## 2021-08-17 ENCOUNTER — Telehealth: Payer: Self-pay | Admitting: Neurology

## 2021-08-17 DIAGNOSIS — G912 (Idiopathic) normal pressure hydrocephalus: Secondary | ICD-10-CM

## 2021-08-17 DIAGNOSIS — M6281 Muscle weakness (generalized): Secondary | ICD-10-CM | POA: Diagnosis not present

## 2021-08-17 DIAGNOSIS — R2689 Other abnormalities of gait and mobility: Secondary | ICD-10-CM | POA: Diagnosis not present

## 2021-08-17 DIAGNOSIS — R293 Abnormal posture: Secondary | ICD-10-CM | POA: Diagnosis not present

## 2021-08-17 NOTE — Telephone Encounter (Signed)
Pt called in and left a message with the access nurse. She is waiting on a call about a follow up about a procedure.

## 2021-08-18 ENCOUNTER — Other Ambulatory Visit: Payer: Self-pay

## 2021-08-18 ENCOUNTER — Telehealth: Payer: Self-pay

## 2021-08-18 NOTE — Telephone Encounter (Signed)
Patient daughter advised patient need a CT before he can have LP.   CT order PA done. PA# [id number]. Rep Lurena Joiner.  Appt: 08/26/21 at 10:30 am arrive 10 am

## 2021-08-18 NOTE — Telephone Encounter (Signed)
Called pt with appointment schedule of Aug. 7th arrive at 830 at Entrance A. (DG FL Guided Lumbar Puncture.Cancelled appointment for this on July 21 st. At 830 am

## 2021-08-19 ENCOUNTER — Telehealth: Payer: Self-pay

## 2021-08-19 NOTE — Telephone Encounter (Signed)
Called PT Donald Whitaker) to let her know of the appointment for Lumber Puncture, she was concerned about it being on a Monday Aug 7th. Said she would check on it and call central scheduling if she needed to change appointment.

## 2021-08-26 ENCOUNTER — Ambulatory Visit (HOSPITAL_COMMUNITY)
Admission: RE | Admit: 2021-08-26 | Discharge: 2021-08-26 | Disposition: A | Discharge status: Home / Self Care | Payer: Medicare PPO | Source: Ambulatory Visit | Attending: Neurology | Admitting: Neurology

## 2021-08-26 ENCOUNTER — Other Ambulatory Visit (HOSPITAL_COMMUNITY): Payer: Medicare PPO

## 2021-08-26 DIAGNOSIS — G912 (Idiopathic) normal pressure hydrocephalus: Secondary | ICD-10-CM | POA: Diagnosis not present

## 2021-08-26 DIAGNOSIS — I6782 Cerebral ischemia: Secondary | ICD-10-CM | POA: Diagnosis not present

## 2021-08-26 DIAGNOSIS — G319 Degenerative disease of nervous system, unspecified: Secondary | ICD-10-CM | POA: Diagnosis not present

## 2021-09-07 ENCOUNTER — Other Ambulatory Visit (HOSPITAL_COMMUNITY): Payer: Self-pay | Admitting: Student

## 2021-09-07 ENCOUNTER — Other Ambulatory Visit: Payer: Self-pay | Admitting: Student

## 2021-09-12 ENCOUNTER — Other Ambulatory Visit: Payer: Self-pay

## 2021-09-12 ENCOUNTER — Ambulatory Visit (HOSPITAL_COMMUNITY)
Admission: RE | Admit: 2021-09-12 | Discharge: 2021-09-12 | Disposition: A | Discharge status: Home / Self Care | Payer: Medicare PPO | Source: Ambulatory Visit | Attending: Neurology | Admitting: Neurology

## 2021-09-12 DIAGNOSIS — R293 Abnormal posture: Secondary | ICD-10-CM | POA: Diagnosis not present

## 2021-09-12 DIAGNOSIS — G912 (Idiopathic) normal pressure hydrocephalus: Secondary | ICD-10-CM | POA: Diagnosis not present

## 2021-09-12 DIAGNOSIS — R296 Repeated falls: Secondary | ICD-10-CM | POA: Diagnosis not present

## 2021-09-12 DIAGNOSIS — R838 Other abnormal findings in cerebrospinal fluid: Secondary | ICD-10-CM | POA: Insufficient documentation

## 2021-09-12 DIAGNOSIS — R030 Elevated blood-pressure reading, without diagnosis of hypertension: Secondary | ICD-10-CM | POA: Insufficient documentation

## 2021-09-12 DIAGNOSIS — R2689 Other abnormalities of gait and mobility: Secondary | ICD-10-CM | POA: Insufficient documentation

## 2021-09-12 LAB — GLUCOSE, CSF: Glucose, CSF: 64 mg/dL (ref 40–70)

## 2021-09-12 LAB — CSF CELL COUNT WITH DIFFERENTIAL
RBC Count, CSF: 1320 /mm3 — ABNORMAL HIGH
Tube #: 3
WBC, CSF: 6 /mm3 — ABNORMAL HIGH (ref 0–5)

## 2021-09-12 LAB — PROTEIN, CSF: Total  Protein, CSF: 46 mg/dL — ABNORMAL HIGH (ref 15–45)

## 2021-09-12 MED ORDER — LIDOCAINE HCL (PF) 1 % IJ SOLN
5.0000 mL | Freq: Once | INTRAMUSCULAR | Status: AC
  Administered 2021-09-12: 5 mL via INTRADERMAL

## 2021-09-12 NOTE — Procedures (Signed)
PROCEDURE SUMMARY:  Successful FL guided lumbar puncture at L3-L4.  Yielded 30cc of clear CSF fluid.  No immediate complications.  Pt tolerated well.   Specimen was sent for labs.  EBL negligible  Deirdre Gryder PA-C 09/12/2021 10:35 AM

## 2021-09-12 NOTE — Discharge Instructions (Signed)
Myelogram and Lumbar Puncture Discharge Instructions  Go home and rest quietly for the next 24 hours.  It is important to lie flat for the next 24 hours.  Get up only to go to the restroom.  You may lie in the bed or on a couch on your back, your stomach, your left side or your right side.  You may have one pillow under your head.  You may have pillows between your knees while you are on your side or under your knees while you are on your back.  DO NOT drive today.  Recline the seat as far back as it will go, while still wearing your seat belt, on the way home.  You may get up to go to the bathroom as needed.  You may sit up for 10 minutes to eat.  You may resume your normal diet and medications unless otherwise indicated.  The incidence of headache, nausea, or vomiting is about 5% (one in 20 patients).  If you develop a headache, lie flat and drink plenty of fluids until the headache goes away.  Caffeinated beverages may be helpful.  If you develop severe nausea and vomiting or a headache that does not go away with flat bed rest, call [phone].  You may resume normal activities after your 24 hours of bed rest is over; however, do not exert yourself strongly or do any heavy lifting tomorrow.  Call your physician for a follow-up appointment.  The results of your myelogram will be sent directly to your physician by the following day.  Discharge instructions have been explained to the patient.  The patient, or the person responsible for the patient, fully understands these instructions.   

## 2021-09-12 NOTE — Progress Notes (Signed)
PT in to ambulate patient.

## 2021-09-13 LAB — PATHOLOGIST SMEAR REVIEW

## 2021-09-15 LAB — CSF CULTURE W GRAM STAIN
Culture: NO GROWTH
Gram Stain: NONE SEEN

## 2021-09-19 ENCOUNTER — Other Ambulatory Visit: Payer: Self-pay

## 2021-09-19 ENCOUNTER — Telehealth: Payer: Self-pay | Admitting: Neurology

## 2021-09-19 DIAGNOSIS — G912 (Idiopathic) normal pressure hydrocephalus: Secondary | ICD-10-CM

## 2021-09-19 NOTE — Telephone Encounter (Signed)
Patient's wife called for spinal tap results from last week.

## 2021-09-20 NOTE — Telephone Encounter (Signed)
Referral has sent to [facility]

## 2021-09-20 NOTE — Progress Notes (Signed)
Sent to [employer] [facility] # [phone] Fax# [phone]

## 2021-09-29 DIAGNOSIS — R269 Unspecified abnormalities of gait and mobility: Secondary | ICD-10-CM | POA: Diagnosis not present

## 2021-10-13 DIAGNOSIS — G912 (Idiopathic) normal pressure hydrocephalus: Secondary | ICD-10-CM | POA: Diagnosis not present

## 2021-10-13 DIAGNOSIS — R488 Other symbolic dysfunctions: Secondary | ICD-10-CM | POA: Diagnosis not present

## 2021-10-13 DIAGNOSIS — F419 Anxiety disorder, unspecified: Secondary | ICD-10-CM | POA: Diagnosis not present

## 2021-10-13 DIAGNOSIS — H903 Sensorineural hearing loss, bilateral: Secondary | ICD-10-CM | POA: Diagnosis not present

## 2021-10-13 DIAGNOSIS — Z79899 Other long term (current) drug therapy: Secondary | ICD-10-CM | POA: Diagnosis not present

## 2021-10-13 DIAGNOSIS — R531 Weakness: Secondary | ICD-10-CM | POA: Diagnosis not present

## 2021-10-13 DIAGNOSIS — R296 Repeated falls: Secondary | ICD-10-CM | POA: Diagnosis not present

## 2021-10-13 DIAGNOSIS — N189 Chronic kidney disease, unspecified: Secondary | ICD-10-CM | POA: Diagnosis not present

## 2021-10-13 DIAGNOSIS — R269 Unspecified abnormalities of gait and mobility: Secondary | ICD-10-CM | POA: Diagnosis not present

## 2021-10-13 DIAGNOSIS — Z981 Arthrodesis status: Secondary | ICD-10-CM | POA: Diagnosis not present

## 2021-10-18 ENCOUNTER — Telehealth: Payer: Self-pay | Admitting: Neurology

## 2021-10-18 DIAGNOSIS — R251 Tremor, unspecified: Secondary | ICD-10-CM

## 2021-10-18 NOTE — Telephone Encounter (Signed)
Pt wife called, she would like to speak with someone about what needs to be next with Donald Whitaker. He went to [facility] for a test and they wasn't told what he needs next. She doesn't want to have to go back and forth to [facility].

## 2021-10-19 NOTE — Telephone Encounter (Signed)
Per Dr.Jaffe,It looks like the test for normal pressure hydrocephalus was negative.  I would like to evaluate for Parkinson's disease - would like to order a DaT scan which is a special type of brain scan to evaluate for possible Parkinson's disease.  I would like him to follow up after testing.    Advised patient wife. Order added.

## 2021-10-24 NOTE — Telephone Encounter (Signed)
[employer] investigation  done, Copy $100 for Datscan.

## 2021-11-10 ENCOUNTER — Encounter (HOSPITAL_COMMUNITY)
Admission: RE | Admit: 2021-11-10 | Discharge: 2021-11-10 | Disposition: A | Discharge status: Home / Self Care | Payer: Medicare PPO | Source: Ambulatory Visit | Attending: Neurology | Admitting: Neurology

## 2021-11-10 ENCOUNTER — Other Ambulatory Visit (HOSPITAL_COMMUNITY): Payer: Medicare PPO

## 2021-11-10 DIAGNOSIS — R251 Tremor, unspecified: Secondary | ICD-10-CM | POA: Diagnosis not present

## 2021-11-10 MED ORDER — POTASSIUM IODIDE (ANTIDOTE) 130 MG PO TABS: 130.0000 mg | ORAL_TABLET | Freq: Once | ORAL | Status: DC

## 2021-11-10 MED ORDER — IOFLUPANE I 123 185 MBQ/2.5ML IV SOLN
4.6000 | Freq: Once | INTRAVENOUS | Status: AC | PRN
  Administered 2021-11-10: 4.6 via INTRAVENOUS
  Filled 2021-11-10: qty 5

## 2021-11-10 MED ORDER — POTASSIUM IODIDE (ANTIDOTE) 130 MG PO TABS
ORAL_TABLET | ORAL | Status: AC
  Filled 2021-11-10: qty 1

## 2021-11-15 DIAGNOSIS — G20A1 Parkinson's disease without dyskinesia, without mention of fluctuations: Secondary | ICD-10-CM | POA: Diagnosis not present

## 2021-11-15 DIAGNOSIS — R269 Unspecified abnormalities of gait and mobility: Secondary | ICD-10-CM | POA: Diagnosis not present

## 2021-11-16 ENCOUNTER — Telehealth: Payer: Self-pay | Admitting: Neurology

## 2021-11-16 NOTE — Telephone Encounter (Signed)
Pt's wife called in and left a message with the access nurse. She would like to get the pt's CAT scan results

## 2021-11-17 NOTE — Telephone Encounter (Signed)
Patient wife advised we are looking lnto having the Datscan read so we can advised of results and next steps.

## 2021-11-21 ENCOUNTER — Telehealth: Payer: Self-pay

## 2021-11-21 NOTE — Telephone Encounter (Signed)
Patient wife advised of husbands Datscan results.  Appt made 11/30/21 at 10:10 am

## 2021-11-21 NOTE — Telephone Encounter (Signed)
-----   Message from Pieter Partridge, DO sent at 11/21/2021  6:34 AM EDT ----- The brain scan is suggestive of Parkinson's disease.  I would like to schedule a work-in follow up visit to discuss further.

## 2021-11-22 DIAGNOSIS — D225 Melanocytic nevi of trunk: Secondary | ICD-10-CM | POA: Diagnosis not present

## 2021-11-22 DIAGNOSIS — L814 Other melanin hyperpigmentation: Secondary | ICD-10-CM | POA: Diagnosis not present

## 2021-11-22 DIAGNOSIS — L2089 Other atopic dermatitis: Secondary | ICD-10-CM | POA: Diagnosis not present

## 2021-11-22 DIAGNOSIS — L218 Other seborrheic dermatitis: Secondary | ICD-10-CM | POA: Diagnosis not present

## 2021-11-22 DIAGNOSIS — L738 Other specified follicular disorders: Secondary | ICD-10-CM | POA: Diagnosis not present

## 2021-11-29 NOTE — Progress Notes (Signed)
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 Follow up 5 months.     Subjective:  Donald Whitaker is an 81 year old right-handed male with CKD and bilateral sensorineural hearing loss who follows up for gait instability and testing results.  He is accompanied by his wife and daughter who provides additional history.   UPDATE: He underwent high-volume LP on 09/12/2021 to evaluate for NPH.  Prior to the test, CT head was required, performed on 08/26/2021, which was personally reviewed and was stable without acute findings.  The high-volume LP findings demonstrated some improvement in mobility after the tap.  He was referred to neurosurgery at [facility] where testing was not consistent with NPH.  He underwent DaT scan on 11/10/2021 which showed significant decreased radiotracer activity in the right striatum suggestive of Parkinson's syndrome.       HISTORY: Around April 2022, he had a fall and couldn't get up on his own.  Then around June, he began noticing unsteady gait.  He says he feels weak in the legs.  When he walks he feels like he is going to tip over.  He has had several falls, usually falling forward, not backward.  He has been athletic and regularly walked and used the gym with weights.  He used to walk 60 minutes, now only 10 minutes.  No back pain, pain in legs or numbness in legs and feet.  He is able to walk up and down the stairs holding the bannister but without difficulty.  Has history of cervical spondylosis but nothing significant and denies neck pain.  Denies back pain, leg pain or numbness in the feet.  Denies involvement of the upper extremities.  Denies dizziness, diplopia or dysphagia.  No bowel or bladder dysfunction.  Labs from August include CPK 44.  CT head without contrast on 09/25/2020 personally  reviewed demonstrated brain atrophy and chronic small vessel ischemic changes but no acute abnormality or out of proportion ventriculomegaly.  MRI of brain with and without contrast on 11/13/2020 personally reviewed moderate chronic small vessel ischemic changes as well as mild ventricular enlargement which again appears to be due to moderate atrophy.  Denies urinary incontinence.  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  She was referred to neurosurgery and underwent ACDF and plating on 01/07/2021.  Despite cervical decompression and physical therapy, no improvement in his balance.  Therefore, underlying NPH was reconsidered.  He has had more short-term memory problems (repeating questions frequently).  Also is sometimes confused.  For example, one time he was talking to his daughter Raquel Sarna on the phone and then asked if she spoke to [place].  No urinary incontinence.  Denies double vision.  Denies slurred speech or difficulty swallowing.  Denies hallucinations.  Denies tremors.  Denies difficulty with dexterity or movement of his upper extremities.  If he stands up, it may take a moment for him to move his legs. 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  He also has longstanding history of tardive dyskinesia/lip smacking.    PAST MEDICAL HISTORY: Past Medical History:  Diagnosis Date   Anxiety    Bilateral sensorineural hearing loss    CKD (chronic kidney disease)     MEDICATIONS:  No current outpatient medications on file prior to visit.   No current facility-administered medications on file prior to visit.    ALLERGIES: No Known Allergies  FAMILY HISTORY: Family History  Problem Relation Age of Onset   Cancer Mother    Heart attack Father    Throat cancer Sister    Asthma Brother       Objective:   Blood pressure (!) 150/85, pulse 75, height 6' (1.829 m), weight 196 lb (88.9 kg), SpO2 95 %. General: No acute distress.  Patient appears well-groomed.   Head:  Normocephalic/atraumatic Neurological Exam: alert and oriented to person, place, and time.  Speech fluent and not dysarthric, language intact.  Slight reduced upward gaze.  Otherwise, CN II-XII intact. Slight increased tone in wrists and elbows, muscle strength 5/5 throughout.  Mildly reduced finger-thumb tapping speed and amplitude.  No tremor.  Sensation to light touch intact.  Magnetic gait with slight shuffling and slight right leg drag.     Shon Millet, DO  CC: Georgann Housekeeper, MD

## 2021-11-30 ENCOUNTER — Encounter: Payer: Self-pay | Admitting: Neurology

## 2021-11-30 ENCOUNTER — Ambulatory Visit: Payer: Medicare PPO | Admitting: Neurology

## 2021-11-30 VITALS — BP 150/85 | HR 75 | Ht 72.0 in | Wt 196.0 lb

## 2021-11-30 DIAGNOSIS — G20A1 Parkinson's disease without dyskinesia, without mention of fluctuations: Secondary | ICD-10-CM | POA: Diagnosis not present

## 2021-11-30 MED ORDER — CARBIDOPA-LEVODOPA 25-100 MG PO TABS: ORAL_TABLET | ORAL | 5 refills | Status: DC

## 2021-11-30 NOTE — Patient Instructions (Signed)
1. Start carbidopa (Sinemet) 25/100mg  tablets.  Week 1: Take 0.5 tablet in morning and 0.5 tablet in evening/bedtime.  Week 2: Take 0.5 tablet in morning, 0.5 tablet in afternoon, and 0.5 tablet in evening/bedtime.  Week 3 & thereafter: Take 1 tablet three times daily.   Take the medication at the same time everyday. The medication does not get absorbed into your body as well, if you take it with protein-containing foods (meat, dairy, beans). Try taking the medication about one hour before meals. If you experience nausea by taking it on an empty stomach, you may take it with carbohydrate-containing food,such as bread or crackers. Side effects to look out for, include dizziness, nausea, vivid dreams and hallucinations. If you experience any of these symptoms, please call us.  Follow up 4 to 5 months

## 2022-01-26 ENCOUNTER — Telehealth: Payer: Self-pay | Admitting: Anesthesiology

## 2022-01-26 NOTE — Telephone Encounter (Signed)
Pt's daughter Donald Whitaker called stating pt is having hallucinations, anxiety has increased and movement has worsen. The carbidopa-levodopa makes him sleep all day. Pt's daughter would like to schedule and appointment sooner than feb/2024.

## 2022-01-27 NOTE — Telephone Encounter (Signed)
Called and spoke to [place]. Informed her of Dr. Everlena Cooper recommendations: "These symptoms may potentially be a side effect of the carbidopa-levodopa.  I would discontinue it.  If he is not improved in 1 to 2 days, he should probably go to Urgent Care to be checked for a UTI.  They may definitely schedule a sooner follow up. "  Elease Hashimoto will stop medication and see if that helps and is aware to take patient to Urgent care if no improvement. She would like someone from the front give her a call to see about getting patient seen sooner. Patients wife verbalized understanding and had no further questions or concerns.

## 2022-01-29 ENCOUNTER — Ambulatory Visit (HOSPITAL_COMMUNITY)
Admission: EM | Admit: 2022-01-29 | Discharge: 2022-01-29 | Disposition: A | Discharge status: Home / Self Care | Payer: Medicare PPO | Attending: Internal Medicine | Admitting: Internal Medicine

## 2022-01-29 ENCOUNTER — Encounter (HOSPITAL_COMMUNITY): Payer: Self-pay | Admitting: Emergency Medicine

## 2022-01-29 DIAGNOSIS — R350 Frequency of micturition: Secondary | ICD-10-CM

## 2022-01-29 DIAGNOSIS — R3911 Hesitancy of micturition: Secondary | ICD-10-CM

## 2022-01-29 HISTORY — DX: Parkinson's disease without dyskinesia, without mention of fluctuations: G20.A1

## 2022-01-29 LAB — POCT URINALYSIS DIPSTICK, ED / UC
Bilirubin Urine: NEGATIVE
Glucose, UA: NEGATIVE mg/dL
Hgb urine dipstick: NEGATIVE
Ketones, ur: NEGATIVE mg/dL
Leukocytes,Ua: NEGATIVE
Nitrite: NEGATIVE
Protein, ur: NEGATIVE mg/dL
Specific Gravity, Urine: 1.02 (ref 1.005–1.030)
Urobilinogen, UA: 0.2 mg/dL (ref 0.0–1.0)
pH: 6 (ref 5.0–8.0)

## 2022-01-29 NOTE — Discharge Instructions (Signed)
Symptoms today are most likely being caused by the prostate which enlarges naturally with age, he may follow-up with primary care for further evaluation and management, you have also been given information to urology which is the bladder specialist for evaluation if you so choose  Urinalysis today was negative for infection, on exam there is no tenderness to the abdomen's or over the kidneys  You may follow-up with this urgent care as needed

## 2022-01-29 NOTE — ED Triage Notes (Signed)
Started on carvidopa - levadopa 2 months ago. Had to stop due to negative side effects. Was warned to be on the lookout for UTI symptoms. Daughter reports that he began having difficulties starting and stopping urinating around 2 days ago. Daughter also reports some mild confusion at baseline

## 2022-01-29 NOTE — ED Provider Notes (Signed)
[facility]    CSN: [id number] Arrival date & time: 01/29/22  1101      History   Chief Complaint Chief Complaint  Patient presents with   Urinary Frequency    HPI Donald Whitaker is a 81 y.o. male.   Patient presents for evaluation of urinary frequency and difficulty starting and stopping stream beginning 3 days ago.  First time symptoms have occurred.  Daughter endorses that neurologist recommended evaluation for UTI due to recent discontinuation of carbidopa-levodopa 2 months ago.  Denies hematuria, dysuria, flank pain, fevers, lower abdominal pain.   Past Medical History:  Diagnosis Date   Anxiety    Bilateral sensorineural hearing loss    CKD (chronic kidney disease)    Parkinson disease     Patient Active Problem List   Diagnosis Date Noted   HNP (herniated nucleus pulposus) with myelopathy, cervical 01/07/2021    Past Surgical History:  Procedure Laterality Date   ANTERIOR CERVICAL DECOMP/DISCECTOMY FUSION N/A 01/07/2021   Procedure: anterior cervical decompression and fusion cervical six-cervical seven;  Surgeon: Donalee Citrin, MD;  Location: [facility] OR;  Service: Neurosurgery;  Laterality: N/A;  3C       Home Medications    Prior to Admission medications   Medication Sig Start Date End Date Taking? Authorizing Provider  carbidopa-levodopa (SINEMET IR) 25-100 MG tablet Take 1/2 tablet twice daily for one week, then 1/2 tablet three times daily for one week, then 1 tablet three times daily. 11/30/21   Drema Dallas, DO  Multiple Vitamin (MULTI-VITAMIN) tablet Take 1 tablet by mouth daily.    [provider]  Vitamin E (VITAMIN E/D-ALPHA NATURAL) 268 MG (400 UNIT) CAPS Take by mouth.    [provider]    Family History Family History  Problem Relation Age of Onset   Cancer Mother    Heart attack Father    Throat cancer Sister    Asthma Brother     Social History Social History   Tobacco Use   Smoking status: Some Days     Types: Cigars   Smokeless tobacco: Former   Tobacco comments:    Cigars stop 2 months ago  Vaping Use   Vaping Use: Never used  Substance Use Topics   Alcohol use: Not Currently    Alcohol/week: 1.0 standard drink of alcohol    Types: 1 Glasses of wine per week    Comment: stopped wine 3 weeks ago   Drug use: Never     Allergies   Patient has no known allergies.   Review of Systems Review of Systems  Respiratory: Negative.    Cardiovascular: Negative.   Genitourinary:  Positive for difficulty urinating and frequency. Negative for decreased urine volume, dysuria, enuresis, flank pain, genital sores, hematuria, penile discharge, penile pain, penile swelling, scrotal swelling, testicular pain and urgency.  Skin: Negative.      Physical Exam Triage Vital Signs ED Triage Vitals [01/29/22 1252]  Enc Vitals Group     BP (!) 176/97     Pulse Rate 63     Resp 16     Temp 98.3 F (36.8 C)     Temp Source Oral     SpO2 92 %     Weight      Height      Head Circumference      Peak Flow      Pain Score 0     Pain Loc      Pain Edu?  Excl. in GC?    No data found.  Updated Vital Signs BP (!) 176/97 (BP Location: Left Arm)   Pulse 63   Temp 98.3 F (36.8 C) (Oral)   Resp 16   SpO2 92%   Visual Acuity Right Eye Distance:   Left Eye Distance:   Bilateral Distance:    Right Eye Near:   Left Eye Near:    Bilateral Near:     Physical Exam Constitutional:      Appearance: Normal appearance.  Eyes:     Extraocular Movements: Extraocular movements intact.  Pulmonary:     Effort: Pulmonary effort is normal.  Abdominal:     General: Abdomen is flat. Bowel sounds are normal. There is no distension.     Palpations: Abdomen is soft.     Tenderness: There is no abdominal tenderness. There is no right CVA tenderness, left CVA tenderness or guarding.  Neurological:     Mental Status: He is alert and oriented to person, place, and time.      UC Treatments /  Results  Labs (all labs ordered are listed, but only abnormal results are displayed) Labs Reviewed  POCT URINALYSIS DIPSTICK, ED / UC    EKG   Radiology No results found.  Procedures Procedures (including critical care time)  Medications Ordered in UC Medications - No data to display  Initial Impression / Assessment and Plan / UC Course  I have reviewed the triage vital signs and the nursing notes.  Pertinent labs & imaging results that were available during my care of the patient were reviewed by me and considered in my medical decision making (see chart for details).  Urinary frequency, urinary hesitancy  Urinalysis is negative, no tenderness is noted to exam discussed with patient and daughter, etiology is most likely enlarged prostate, no further treatment at this time, recommended follow-up with PCP also given referral to urology to be used as needed Final Clinical Impressions(s) / UC Diagnoses   Final diagnoses:  None   Discharge Instructions   None    ED Prescriptions   None    PDMP not reviewed this encounter.   Valinda Hoar, NP 01/29/22 1324

## 2022-02-01 NOTE — Telephone Encounter (Signed)
Pt's wife Elease Hashimoto called stating she would like to speak to nurse regarding her husbands ER visit.

## 2022-02-02 MED ORDER — CARBIDOPA-LEVODOPA ER 25-100 MG PO TBCR: EXTENDED_RELEASE_TABLET | ORAL | 0 refills | Status: DC

## 2022-02-02 NOTE — Telephone Encounter (Signed)
Called and spoke to patients wife Elease Hashimoto.She stated patient has been off Carbidopa-Levodopa for almost a week. Elease Hashimoto reports patients Hallucinations have stopped (she says he only had one episode in total). Since being off the Carbidopa Levodopa patient has had two falls and his balance has been getting worse. Wife is worried that the falls are becoming more frequent since stopping the medication. Pateint was taken to the ER and UA was done. Patient would like to know what Dr. Everlena Cooper would like to do? She is aware I will call her back.

## 2022-02-02 NOTE — Telephone Encounter (Signed)
Called and spoke to patients wife and informed her that Per Dr. Everlena Cooper  If he is not on carbidopa-levodopa, we will fall more often.  Dr Everlena Cooper would like to start him on extended release carbidopa-levodopa, which tends to be better tolerated that immediate release.  If patient agreeable, please send prescription for extended release carbidopa-levodopa (Sinemet CR) 25/100mg :  Take 1 tablet at bedtime for one week, then 1 tablet twice daily for one week, then 1 tablet three times daily.   Also informed patients wife that Each dose should be 6 hours apart.  Take the medication at the same time everyday. Try taking the medication about one hour before meals. If you experience nausea by taking it on an empty stomach, you may take it with carbohydrate-containing food,such as bread or crackers. Side effects to look out for, include dizziness, nausea, vivid dreams and hallucinations. If you experience any of these symptoms, please call us.  Patients wife verbalized understanding and will call us if she notices any side effects, has any issues, or concerns. Patients wife is aware that prescription will be sent.

## 2022-02-25 ENCOUNTER — Other Ambulatory Visit: Payer: Self-pay | Admitting: Neurology

## 2022-02-27 ENCOUNTER — Other Ambulatory Visit: Payer: Self-pay

## 2022-02-27 ENCOUNTER — Telehealth: Payer: Self-pay | Admitting: Neurology

## 2022-02-27 DIAGNOSIS — G20A1 Parkinson's disease without dyskinesia, without mention of fluctuations: Secondary | ICD-10-CM

## 2022-02-27 DIAGNOSIS — R29898 Other symptoms and signs involving the musculoskeletal system: Secondary | ICD-10-CM

## 2022-02-27 DIAGNOSIS — R296 Repeated falls: Secondary | ICD-10-CM

## 2022-02-27 NOTE — Telephone Encounter (Signed)
Pt's daughter called in wanting to see if Dr. Tomi Likens would order physical therapy to come to the pt's home? She thinks with his mobility issues it could be helpful.

## 2022-02-27 NOTE — Telephone Encounter (Signed)
Called pt and made her aware of order. Sent to [facility] waiting to hear back to see if they have an opening.

## 2022-03-15 NOTE — Progress Notes (Unsigned)
NEUROLOGY FOLLOW UP OFFICE NOTE  GOLDMAN BIRCHALL [id number]  Assessment/Plan:   Idiopathic Parkinson's disease   ***     Subjective:  Donald Whitaker is an 82 year old right-handed male with CKD and bilateral sensorineural hearing loss who follows up for gait instability and testing results.  He is accompanied by his wife and daughter who provides additional history.   UPDATE: Current medication:  Carbidopa-levodopa 25/100mg  three times daily  Started C-L last visit.  Also referred to physical therapy.  ***     HISTORY: Around April 2022, he had a fall and couldn't get up on his own.  Then around June, he began noticing unsteady gait.  He says he feels weak in the legs.  When he walks he feels like he is going to tip over.  He has had several falls, usually falling forward, not backward.  He has been athletic and regularly walked and used the gym with weights.  He used to walk 60 minutes, now only 10 minutes.  No back pain, pain in legs or numbness in legs and feet.  He is able to walk up and down the stairs holding the bannister but without difficulty.  Has history of cervical spondylosis but nothing significant and denies neck pain.  Denies back pain, leg pain or numbness in the feet.  Denies involvement of the upper extremities.  Denies dizziness, diplopia or dysphagia.  No bowel or bladder dysfunction.  Labs from August include CPK 44.  CT head without contrast on 09/25/2020 personally reviewed demonstrated brain atrophy and chronic small vessel ischemic changes but no acute abnormality or out of proportion ventriculomegaly.  MRI of brain with and without contrast on 11/13/2020 personally reviewed moderate chronic small vessel ischemic changes as well as mild ventricular enlargement which again appears to be due to moderate atrophy.  Denies urinary incontinence.  MRI of cervical spine on 12/18/2020 showed herniated nucleus pulposus causing severe spinal canal stenosis at C6-C7  causing compressive myelopathy with increased T2 signal within the spinal cord.  She was referred to neurosurgery and underwent ACDF and plating on 01/07/2021.  Despite cervical decompression and physical therapy, no improvement in his balance.  Therefore, underlying NPH was reconsidered.  He underwent high-volume LP on 09/12/2021 to evaluate for NPH.  Prior to the test, CT head was required, performed on 08/26/2021, which was personally reviewed and was stable without acute findings.  The high-volume LP findings demonstrated some improvement in mobility after the tap.  He was referred to neurosurgery at [facility] where testing was not consistent with NPH.  He underwent DaT scan on 11/10/2021 which showed significant decreased radiotracer activity in the right striatum suggestive of Parkinson's syndrome.     He has had more short-term memory problems (repeating questions frequently).  Also is sometimes confused.  For example, one time he was talking to his daughter Donald Whitaker on the phone and then asked if she spoke to [place].  No urinary incontinence.  Denies double vision.  Denies slurred speech or difficulty swallowing.  Denies hallucinations.  Denies tremors.  Denies difficulty with dexterity or movement of his upper extremities.  If he stands up, it may take a moment for him to move his legs. 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  He also has longstanding history of tardive dyskinesia/lip smacking.    PAST MEDICAL HISTORY: Past Medical History:  Diagnosis Date   Anxiety    Bilateral sensorineural hearing loss    CKD (  chronic kidney disease)    Parkinson disease     MEDICATIONS: Current Outpatient Medications on File Prior to Visit  Medication Sig Dispense Refill   carbidopa-levodopa (SINEMET IR) 25-100 MG tablet Take 1/2 tablet twice daily for one week, then 1/2 tablet three times daily for one week, then 1 tablet three times daily. 90 tablet 5   Carbidopa-Levodopa ER  (SINEMET CR) 25-100 MG tablet controlled release TAKE 1 TABLET AT BEDTIME FOR ONE WEEK, THEN 1 TABLET TWICE DAILY FOR ONE WEEK, THEN 1 TABLET THREE TIMES DAILY. 270 tablet 0   Multiple Vitamin (MULTI-VITAMIN) tablet Take 1 tablet by mouth daily.     Vitamin E (VITAMIN E/D-ALPHA NATURAL) 268 MG (400 UNIT) CAPS Take by mouth.     No current facility-administered medications on file prior to visit.    ALLERGIES: No Known Allergies  FAMILY HISTORY: Family History  Problem Relation Age of Onset   Cancer Mother    Heart attack Father    Throat cancer Sister    Asthma Brother       Objective:  *** General: No acute distress.  Patient appears well-groomed.   Head:  Normocephalic/atraumatic Neurological Exam: alert and oriented to person, place, and time.  Speech fluent and not dysarthric, language intact.  Slight reduced upward gaze.  Otherwise, CN II-XII intact. Slight increased tone in wrists and elbows, muscle strength 5/5 throughout.  Mildly reduced finger-thumb tapping speed and amplitude.  No tremor.  Sensation to light touch intact.  Magnetic gait with slight shuffling and slight right leg drag.     Donald Clines, DO  CC: Donald Low, MD

## 2022-03-16 ENCOUNTER — Ambulatory Visit: Payer: Medicare PPO | Admitting: Neurology

## 2022-03-16 ENCOUNTER — Encounter: Payer: Self-pay | Admitting: Neurology

## 2022-03-16 VITALS — BP 146/87 | HR 66 | Ht 72.0 in | Wt 191.0 lb

## 2022-03-16 DIAGNOSIS — G20A1 Parkinson's disease without dyskinesia, without mention of fluctuations: Secondary | ICD-10-CM | POA: Diagnosis not present

## 2022-03-16 DIAGNOSIS — F0390 Unspecified dementia without behavioral disturbance: Secondary | ICD-10-CM | POA: Diagnosis not present

## 2022-03-16 MED ORDER — CARBIDOPA-LEVODOPA ER 50-200 MG PO TBCR: 1.0000 | EXTENDED_RELEASE_TABLET | Freq: Three times a day (TID) | ORAL | 5 refills | Status: DC

## 2022-03-16 NOTE — Patient Instructions (Signed)
Increase carbidopa-levodopa extended release to 50/200mg  three times daily Modified barium swallow Continue exercises  Will set up to have a social worker Clarise Cruz) contact you to discuss further resources and support If you would like to pursue skin biopsy, please let me know (to differentiate between idiopathic parkinson's disease vs progressive supranuclear palsy Otherwise, follow up 6 months.

## 2022-03-16 NOTE — Progress Notes (Signed)
PA started, NO PA Needed.

## 2022-03-17 ENCOUNTER — Telehealth (HOSPITAL_COMMUNITY): Payer: Self-pay

## 2022-03-17 DIAGNOSIS — M47812 Spondylosis without myelopathy or radiculopathy, cervical region: Secondary | ICD-10-CM | POA: Diagnosis not present

## 2022-03-17 DIAGNOSIS — Z556 Problems related to health literacy: Secondary | ICD-10-CM | POA: Diagnosis not present

## 2022-03-17 DIAGNOSIS — N390 Urinary tract infection, site not specified: Secondary | ICD-10-CM | POA: Diagnosis not present

## 2022-03-17 DIAGNOSIS — F17210 Nicotine dependence, cigarettes, uncomplicated: Secondary | ICD-10-CM | POA: Diagnosis not present

## 2022-03-17 DIAGNOSIS — G20A1 Parkinson's disease without dyskinesia, without mention of fluctuations: Secondary | ICD-10-CM | POA: Diagnosis not present

## 2022-03-17 DIAGNOSIS — M5020 Other cervical disc displacement, unspecified cervical region: Secondary | ICD-10-CM | POA: Diagnosis not present

## 2022-03-17 DIAGNOSIS — N189 Chronic kidney disease, unspecified: Secondary | ICD-10-CM | POA: Diagnosis not present

## 2022-03-17 DIAGNOSIS — H903 Sensorineural hearing loss, bilateral: Secondary | ICD-10-CM | POA: Diagnosis not present

## 2022-03-17 DIAGNOSIS — F419 Anxiety disorder, unspecified: Secondary | ICD-10-CM | POA: Diagnosis not present

## 2022-03-17 NOTE — Telephone Encounter (Signed)
Called and spoke with Jenny Reichmann (son of patient) to schedule Modified Barium Swallow - Jenny Reichmann stated he is going to talk with his sister and mom and call us back to schedule. Will continue to follow.

## 2022-03-21 ENCOUNTER — Other Ambulatory Visit (HOSPITAL_COMMUNITY): Payer: Self-pay

## 2022-03-21 ENCOUNTER — Telehealth: Payer: Self-pay

## 2022-03-21 DIAGNOSIS — M5020 Other cervical disc displacement, unspecified cervical region: Secondary | ICD-10-CM | POA: Diagnosis not present

## 2022-03-21 DIAGNOSIS — H903 Sensorineural hearing loss, bilateral: Secondary | ICD-10-CM | POA: Diagnosis not present

## 2022-03-21 DIAGNOSIS — Z556 Problems related to health literacy: Secondary | ICD-10-CM | POA: Diagnosis not present

## 2022-03-21 DIAGNOSIS — G20A1 Parkinson's disease without dyskinesia, without mention of fluctuations: Secondary | ICD-10-CM | POA: Diagnosis not present

## 2022-03-21 DIAGNOSIS — F419 Anxiety disorder, unspecified: Secondary | ICD-10-CM | POA: Diagnosis not present

## 2022-03-21 DIAGNOSIS — N189 Chronic kidney disease, unspecified: Secondary | ICD-10-CM | POA: Diagnosis not present

## 2022-03-21 DIAGNOSIS — R1310 Dysphagia, unspecified: Secondary | ICD-10-CM

## 2022-03-21 DIAGNOSIS — N390 Urinary tract infection, site not specified: Secondary | ICD-10-CM | POA: Diagnosis not present

## 2022-03-21 DIAGNOSIS — M47812 Spondylosis without myelopathy or radiculopathy, cervical region: Secondary | ICD-10-CM | POA: Diagnosis not present

## 2022-03-21 DIAGNOSIS — F17210 Nicotine dependence, cigarettes, uncomplicated: Secondary | ICD-10-CM | POA: Diagnosis not present

## 2022-03-21 NOTE — Telephone Encounter (Signed)
Spoke to the wife to give appt date and time for Barium.  Per Wife they have decided to not have the test done.  Tried calling Patient daughter Raquel Sarna to confirm. NO Answer. VM Full.

## 2022-03-21 NOTE — Telephone Encounter (Signed)
Daughter is calling back, is okay with canceling as right now patient has a lot of appointments and dont see any huge concerns with his swallowing at this time.

## 2022-03-22 ENCOUNTER — Telehealth: Payer: Self-pay | Admitting: Neurology

## 2022-03-22 NOTE — Telephone Encounter (Signed)
Huston Foley from [facility] called in to let Dr. Tomi Likens know that the pt's wife wanted to cancel PT services. She feels like it's too much at this time.

## 2022-03-23 NOTE — Telephone Encounter (Signed)
LMOVM for Mose cone to cancel Barium Swallow.

## 2022-03-29 DIAGNOSIS — L821 Other seborrheic keratosis: Secondary | ICD-10-CM | POA: Diagnosis not present

## 2022-03-29 DIAGNOSIS — D225 Melanocytic nevi of trunk: Secondary | ICD-10-CM | POA: Diagnosis not present

## 2022-03-29 DIAGNOSIS — L728 Other follicular cysts of the skin and subcutaneous tissue: Secondary | ICD-10-CM | POA: Diagnosis not present

## 2022-03-29 DIAGNOSIS — L814 Other melanin hyperpigmentation: Secondary | ICD-10-CM | POA: Diagnosis not present

## 2022-04-04 ENCOUNTER — Ambulatory Visit: Payer: Medicare PPO | Admitting: Neurology

## 2022-04-04 ENCOUNTER — Encounter (HOSPITAL_COMMUNITY): Payer: Medicare PPO

## 2022-04-04 ENCOUNTER — Ambulatory Visit (HOSPITAL_COMMUNITY): Payer: Medicare PPO

## 2022-05-02 ENCOUNTER — Telehealth: Payer: Self-pay

## 2022-05-02 NOTE — Telephone Encounter (Signed)
Patient and his wife called and asked for someone to call them back would not disclose any information, asked if someone from the back would call.

## 2022-05-03 NOTE — Telephone Encounter (Signed)
Several attempts no answer

## 2022-05-03 NOTE — Telephone Encounter (Signed)
No answer, when patient calls back please get more detailed information thanks.

## 2022-05-30 ENCOUNTER — Other Ambulatory Visit: Payer: Self-pay | Admitting: Neurology

## 2022-07-25 ENCOUNTER — Telehealth: Payer: Self-pay | Admitting: Neurology

## 2022-07-25 NOTE — Telephone Encounter (Signed)
Patient's daughter Benett Eustace) wanted information about getting a RX for a "Lift to Assist" Recliner, to help patient stand up. /KB  Call back numebr : [phone]

## 2022-07-25 NOTE — Telephone Encounter (Signed)
Per patient daughter her father has gotten worse not sure if he needs to be seen sooner or keep his appt 09/2022.

## 2022-07-26 NOTE — Telephone Encounter (Signed)
Per Dr.Jaffe, We can order lift to assist recliner.  His condition is progressive, so unfortunately he will get worse.  I need to know specifics. LMOVM for daughter to callus back.

## 2022-08-02 ENCOUNTER — Telehealth: Payer: Self-pay | Admitting: Neurology

## 2022-08-02 DIAGNOSIS — G20A1 Parkinson's disease without dyskinesia, without mention of fluctuations: Secondary | ICD-10-CM

## 2022-08-02 NOTE — Telephone Encounter (Signed)
Patient wife advised of Dr.jaffe note, If there is an acute change, then I would follow up with PCP or Urgent Care to rule out UTI.  Otherwise, this is unfortunately part of the progression of the disease.

## 2022-08-02 NOTE — Telephone Encounter (Signed)
Pt daughter would like to speak to someone about a lift chair

## 2022-08-02 NOTE — Telephone Encounter (Signed)
Increase Incontinence, Increased Cognitive issues. Family wanted to know there is anything they can do.

## 2022-09-11 ENCOUNTER — Other Ambulatory Visit: Payer: Self-pay | Admitting: Neurology

## 2022-09-19 NOTE — Progress Notes (Unsigned)
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  However, symptoms otherwise seem more consistent with idiopathic Parkinson's disease (falls forward)   Increase carbidopa-levodopa CR to 50/200mg  three times daily Check baseline modified barium swallow. Continue home exercises Will set patient and family with appointment to speak with our social worker, Donald Whitaker, regarding resources and assistance To help differentiate between PD and PSP, suggested skin biopsy.  Defers at this time. Follow up 6 months.     Subjective:  Donald Whitaker is an 82 year old right-handed male with CKD and bilateral sensorineural hearing loss who follows up for gait instability and testing results.  He is accompanied by his wife and daughter who provides additional history.   UPDATE: Current medication:  Carbidopa-levodopa CR 50/200mg  three times daily (9AM, 2PM, 9PM)  Increased C-L CR last visit.  ***  He has declined.  Decreased mobility.  Reduce hygiene.  Increased urinary incontinence.  It usually occurs while asleep (during the night or when he naps during the day).  He is more confused.  He may not recognize acquaintances.  He has exhibited some mild paranoia.  Short term memory problems - keeps repeating himself.  He has a home aide now.  He has an accessible bathroom and a hospital bed so it can be lowered.  Overall he sleeps well.  Appetite is improved.  Denies trouble swallowing.  He falls at home.  He falls forwards, not backwards.  At home, he uses the walker.  Outside of the house he uses the wheelchair for convenience. Denies double vision.     HISTORY: Around April 2022, he had a fall and couldn't get up on his own.  Then around June, he began noticing unsteady gait.  He says he feels weak in the legs.  When he walks he feels like  he is going to tip over.  He has had several falls, usually falling forward, not backward.  He has been athletic and regularly walked and used the gym with weights.  He used to walk 60 minutes, now only 10 minutes.  No back pain, pain in legs or numbness in legs and feet.  He is able to walk up and down the stairs holding the bannister but without difficulty.  Has history of cervical spondylosis but nothing significant and denies neck pain.  Denies back pain, leg pain or numbness in the feet.  Denies involvement of the upper extremities.  Denies dizziness, diplopia or dysphagia.  No bowel or bladder dysfunction.  Labs from August include CPK 44.  CT head without contrast on 09/25/2020 personally reviewed demonstrated brain atrophy and chronic small vessel ischemic changes but no acute abnormality or out of proportion ventriculomegaly.  MRI of brain with and without contrast on 11/13/2020 personally reviewed moderate chronic small vessel ischemic changes as well as mild ventricular enlargement which again appears to be due to moderate atrophy.  Denies urinary incontinence.  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  She was referred to neurosurgery and underwent ACDF and plating on 01/07/2021.  Despite cervical decompression and physical therapy, no improvement in his balance.  Therefore, underlying NPH was reconsidered.  He underwent high-volume LP on 09/12/2021 to evaluate for NPH.  Prior to the test,  CT head was required, performed on 08/26/2021, which was personally reviewed and was stable without acute findings.  The high-volume LP findings demonstrated some improvement in mobility after the tap.  He was referred to neurosurgery at [facility] where testing was not consistent with NPH.  He underwent DaT scan on 11/10/2021 which showed significant decreased radiotracer  activity in the right striatum suggestive of Parkinson's syndrome.     He has had more short-term memory problems (repeating questions frequently).  Also is sometimes confused.  For example, one time he was talking to his daughter Donald Whitaker on the phone and then asked if she spoke to Donald Whitaker.  No urinary incontinence.  Denies double vision.  Denies slurred speech or difficulty swallowing.  Denies hallucinations.  Denies tremors.  Denies difficulty with dexterity or movement of his upper extremities.  If he stands up, it may take a moment for him to move his legs. 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  He also has longstanding history of tardive dyskinesia/lip smacking.    Past medications:  C-L IR (night tremor, confusion)  PAST MEDICAL HISTORY: Past Medical History:  Diagnosis Date   Anxiety    Bilateral sensorineural hearing loss    CKD (chronic kidney disease)    Parkinson disease     MEDICATIONS: Current Outpatient Medications on File Prior to Visit  Medication Sig Dispense Refill   carbidopa-levodopa (SINEMET CR) 50-200 MG tablet TAKE 1 TABLET BY MOUTH THREE TIMES A DAY 90 tablet 0   Multiple Vitamin (MULTI-VITAMIN) tablet Take 1 tablet by mouth daily.     Vitamin E (VITAMIN E/D-ALPHA NATURAL) 268 MG (400 UNIT) CAPS Take by mouth.     No current facility-administered medications on file prior to visit.    ALLERGIES: No Known Allergies  FAMILY HISTORY: Family History  Problem Relation Age of Onset   Cancer Mother    Heart attack Father    Throat cancer Sister    Asthma Brother       Objective:  *** General: No acute distress.  Patient appears well-groomed.   Head:  Normocephalic/atraumatic Neurological Exam: alert and oriented to person, place, and time.  Speech fluent and not dysarthric, language intact.  Slight reduced upward gaze.  Otherwise, CN II-XII intact.  Slight increased tone in wrists and elbows.  Muscle strength 5/5  throughout.  Mildly reduced finger-thumb tapping speed and amplitude.  No tremor.  Sensation to light touch intact.  Upward posture, magnetic gait with slight shuffling and slight right leg drag.  ***    Shon Millet, DO  CC: Georgann Housekeeper, MD

## 2022-09-20 ENCOUNTER — Ambulatory Visit: Payer: Medicare PPO | Admitting: Neurology

## 2022-09-20 ENCOUNTER — Encounter: Payer: Self-pay | Admitting: Neurology

## 2022-09-20 VITALS — BP 152/81 | HR 65 | Ht 73.0 in | Wt 190.0 lb

## 2022-09-20 DIAGNOSIS — G20A1 Parkinson's disease without dyskinesia, without mention of fluctuations: Secondary | ICD-10-CM

## 2022-09-20 MED ORDER — CARBIDOPA-LEVODOPA ER 50-200 MG PO TBCR: 1.0000 | EXTENDED_RELEASE_TABLET | Freq: Three times a day (TID) | ORAL | 5 refills | Status: DC

## 2022-09-20 NOTE — Patient Instructions (Signed)
Continue carbidopa-levodopa 50/200mg  extended release three times daily (9 AM, 2 PM and 9 PM) Physical therapy Will have our social worker, Huntley Dec, contact you. Follow up 6 months.

## 2022-09-22 DIAGNOSIS — F419 Anxiety disorder, unspecified: Secondary | ICD-10-CM | POA: Diagnosis not present

## 2022-09-22 DIAGNOSIS — G20B1 Parkinson's disease with dyskinesia, without mention of fluctuations: Secondary | ICD-10-CM | POA: Diagnosis not present

## 2022-09-22 DIAGNOSIS — H903 Sensorineural hearing loss, bilateral: Secondary | ICD-10-CM | POA: Diagnosis not present

## 2022-09-22 DIAGNOSIS — N189 Chronic kidney disease, unspecified: Secondary | ICD-10-CM | POA: Diagnosis not present

## 2022-09-22 DIAGNOSIS — M47812 Spondylosis without myelopathy or radiculopathy, cervical region: Secondary | ICD-10-CM | POA: Diagnosis not present

## 2022-09-22 DIAGNOSIS — Z9181 History of falling: Secondary | ICD-10-CM | POA: Diagnosis not present

## 2022-09-26 DIAGNOSIS — H903 Sensorineural hearing loss, bilateral: Secondary | ICD-10-CM | POA: Diagnosis not present

## 2022-09-26 DIAGNOSIS — Z9181 History of falling: Secondary | ICD-10-CM | POA: Diagnosis not present

## 2022-09-26 DIAGNOSIS — G20B1 Parkinson's disease with dyskinesia, without mention of fluctuations: Secondary | ICD-10-CM | POA: Diagnosis not present

## 2022-09-26 DIAGNOSIS — F419 Anxiety disorder, unspecified: Secondary | ICD-10-CM | POA: Diagnosis not present

## 2022-09-26 DIAGNOSIS — M47812 Spondylosis without myelopathy or radiculopathy, cervical region: Secondary | ICD-10-CM | POA: Diagnosis not present

## 2022-09-26 DIAGNOSIS — N189 Chronic kidney disease, unspecified: Secondary | ICD-10-CM | POA: Diagnosis not present

## 2022-09-28 DIAGNOSIS — Z9181 History of falling: Secondary | ICD-10-CM | POA: Diagnosis not present

## 2022-09-28 DIAGNOSIS — M47812 Spondylosis without myelopathy or radiculopathy, cervical region: Secondary | ICD-10-CM | POA: Diagnosis not present

## 2022-09-28 DIAGNOSIS — F419 Anxiety disorder, unspecified: Secondary | ICD-10-CM | POA: Diagnosis not present

## 2022-09-28 DIAGNOSIS — H903 Sensorineural hearing loss, bilateral: Secondary | ICD-10-CM | POA: Diagnosis not present

## 2022-09-28 DIAGNOSIS — G20B1 Parkinson's disease with dyskinesia, without mention of fluctuations: Secondary | ICD-10-CM | POA: Diagnosis not present

## 2022-09-28 DIAGNOSIS — N189 Chronic kidney disease, unspecified: Secondary | ICD-10-CM | POA: Diagnosis not present

## 2022-10-04 DIAGNOSIS — Z9181 History of falling: Secondary | ICD-10-CM | POA: Diagnosis not present

## 2022-10-04 DIAGNOSIS — G20B1 Parkinson's disease with dyskinesia, without mention of fluctuations: Secondary | ICD-10-CM | POA: Diagnosis not present

## 2022-10-04 DIAGNOSIS — M47812 Spondylosis without myelopathy or radiculopathy, cervical region: Secondary | ICD-10-CM | POA: Diagnosis not present

## 2022-10-04 DIAGNOSIS — F419 Anxiety disorder, unspecified: Secondary | ICD-10-CM | POA: Diagnosis not present

## 2022-10-04 DIAGNOSIS — N189 Chronic kidney disease, unspecified: Secondary | ICD-10-CM | POA: Diagnosis not present

## 2022-10-04 DIAGNOSIS — H903 Sensorineural hearing loss, bilateral: Secondary | ICD-10-CM | POA: Diagnosis not present

## 2022-10-06 DIAGNOSIS — G20B1 Parkinson's disease with dyskinesia, without mention of fluctuations: Secondary | ICD-10-CM | POA: Diagnosis not present

## 2022-10-06 DIAGNOSIS — H903 Sensorineural hearing loss, bilateral: Secondary | ICD-10-CM | POA: Diagnosis not present

## 2022-10-06 DIAGNOSIS — N189 Chronic kidney disease, unspecified: Secondary | ICD-10-CM | POA: Diagnosis not present

## 2022-10-06 DIAGNOSIS — Z9181 History of falling: Secondary | ICD-10-CM | POA: Diagnosis not present

## 2022-10-06 DIAGNOSIS — F419 Anxiety disorder, unspecified: Secondary | ICD-10-CM | POA: Diagnosis not present

## 2022-10-06 DIAGNOSIS — M47812 Spondylosis without myelopathy or radiculopathy, cervical region: Secondary | ICD-10-CM | POA: Diagnosis not present

## 2022-10-10 DIAGNOSIS — N189 Chronic kidney disease, unspecified: Secondary | ICD-10-CM | POA: Diagnosis not present

## 2022-10-10 DIAGNOSIS — F419 Anxiety disorder, unspecified: Secondary | ICD-10-CM | POA: Diagnosis not present

## 2022-10-10 DIAGNOSIS — Z9181 History of falling: Secondary | ICD-10-CM | POA: Diagnosis not present

## 2022-10-10 DIAGNOSIS — M47812 Spondylosis without myelopathy or radiculopathy, cervical region: Secondary | ICD-10-CM | POA: Diagnosis not present

## 2022-10-10 DIAGNOSIS — H903 Sensorineural hearing loss, bilateral: Secondary | ICD-10-CM | POA: Diagnosis not present

## 2022-10-10 DIAGNOSIS — G20B1 Parkinson's disease with dyskinesia, without mention of fluctuations: Secondary | ICD-10-CM | POA: Diagnosis not present

## 2022-10-13 DIAGNOSIS — N189 Chronic kidney disease, unspecified: Secondary | ICD-10-CM | POA: Diagnosis not present

## 2022-10-13 DIAGNOSIS — F419 Anxiety disorder, unspecified: Secondary | ICD-10-CM | POA: Diagnosis not present

## 2022-10-13 DIAGNOSIS — Z9181 History of falling: Secondary | ICD-10-CM | POA: Diagnosis not present

## 2022-10-13 DIAGNOSIS — H903 Sensorineural hearing loss, bilateral: Secondary | ICD-10-CM | POA: Diagnosis not present

## 2022-10-13 DIAGNOSIS — G20B1 Parkinson's disease with dyskinesia, without mention of fluctuations: Secondary | ICD-10-CM | POA: Diagnosis not present

## 2022-10-13 DIAGNOSIS — M47812 Spondylosis without myelopathy or radiculopathy, cervical region: Secondary | ICD-10-CM | POA: Diagnosis not present

## 2022-10-18 DIAGNOSIS — H903 Sensorineural hearing loss, bilateral: Secondary | ICD-10-CM | POA: Diagnosis not present

## 2022-10-18 DIAGNOSIS — G20B1 Parkinson's disease with dyskinesia, without mention of fluctuations: Secondary | ICD-10-CM | POA: Diagnosis not present

## 2022-10-18 DIAGNOSIS — F419 Anxiety disorder, unspecified: Secondary | ICD-10-CM | POA: Diagnosis not present

## 2022-10-18 DIAGNOSIS — M47812 Spondylosis without myelopathy or radiculopathy, cervical region: Secondary | ICD-10-CM | POA: Diagnosis not present

## 2022-10-18 DIAGNOSIS — Z9181 History of falling: Secondary | ICD-10-CM | POA: Diagnosis not present

## 2022-10-18 DIAGNOSIS — N189 Chronic kidney disease, unspecified: Secondary | ICD-10-CM | POA: Diagnosis not present

## 2022-10-20 DIAGNOSIS — M47812 Spondylosis without myelopathy or radiculopathy, cervical region: Secondary | ICD-10-CM | POA: Diagnosis not present

## 2022-10-20 DIAGNOSIS — G20B1 Parkinson's disease with dyskinesia, without mention of fluctuations: Secondary | ICD-10-CM | POA: Diagnosis not present

## 2022-10-20 DIAGNOSIS — Z9181 History of falling: Secondary | ICD-10-CM | POA: Diagnosis not present

## 2022-10-20 DIAGNOSIS — H903 Sensorineural hearing loss, bilateral: Secondary | ICD-10-CM | POA: Diagnosis not present

## 2022-10-20 DIAGNOSIS — N189 Chronic kidney disease, unspecified: Secondary | ICD-10-CM | POA: Diagnosis not present

## 2022-10-20 DIAGNOSIS — F419 Anxiety disorder, unspecified: Secondary | ICD-10-CM | POA: Diagnosis not present

## 2022-10-24 DIAGNOSIS — Z0000 Encounter for general adult medical examination without abnormal findings: Secondary | ICD-10-CM | POA: Diagnosis not present

## 2022-10-24 DIAGNOSIS — E038 Other specified hypothyroidism: Secondary | ICD-10-CM | POA: Diagnosis not present

## 2022-10-24 DIAGNOSIS — E782 Mixed hyperlipidemia: Secondary | ICD-10-CM | POA: Diagnosis not present

## 2022-10-24 DIAGNOSIS — N400 Enlarged prostate without lower urinary tract symptoms: Secondary | ICD-10-CM | POA: Diagnosis not present

## 2022-10-24 DIAGNOSIS — Z1331 Encounter for screening for depression: Secondary | ICD-10-CM | POA: Diagnosis not present

## 2022-10-24 DIAGNOSIS — Z23 Encounter for immunization: Secondary | ICD-10-CM | POA: Diagnosis not present

## 2022-10-24 DIAGNOSIS — G20A1 Parkinson's disease without dyskinesia, without mention of fluctuations: Secondary | ICD-10-CM | POA: Diagnosis not present

## 2022-10-24 DIAGNOSIS — Z9181 History of falling: Secondary | ICD-10-CM | POA: Diagnosis not present

## 2022-10-24 DIAGNOSIS — N182 Chronic kidney disease, stage 2 (mild): Secondary | ICD-10-CM | POA: Diagnosis not present

## 2022-10-24 DIAGNOSIS — R7301 Impaired fasting glucose: Secondary | ICD-10-CM | POA: Diagnosis not present

## 2022-10-24 DIAGNOSIS — I700 Atherosclerosis of aorta: Secondary | ICD-10-CM | POA: Diagnosis not present

## 2022-10-24 DIAGNOSIS — L719 Rosacea, unspecified: Secondary | ICD-10-CM | POA: Diagnosis not present

## 2022-10-25 DIAGNOSIS — N189 Chronic kidney disease, unspecified: Secondary | ICD-10-CM | POA: Diagnosis not present

## 2022-10-25 DIAGNOSIS — M47812 Spondylosis without myelopathy or radiculopathy, cervical region: Secondary | ICD-10-CM | POA: Diagnosis not present

## 2022-10-25 DIAGNOSIS — Z9181 History of falling: Secondary | ICD-10-CM | POA: Diagnosis not present

## 2022-10-25 DIAGNOSIS — G20B1 Parkinson's disease with dyskinesia, without mention of fluctuations: Secondary | ICD-10-CM | POA: Diagnosis not present

## 2022-10-25 DIAGNOSIS — H903 Sensorineural hearing loss, bilateral: Secondary | ICD-10-CM | POA: Diagnosis not present

## 2022-10-25 DIAGNOSIS — F419 Anxiety disorder, unspecified: Secondary | ICD-10-CM | POA: Diagnosis not present

## 2022-11-01 DIAGNOSIS — Z9181 History of falling: Secondary | ICD-10-CM | POA: Diagnosis not present

## 2022-11-01 DIAGNOSIS — N189 Chronic kidney disease, unspecified: Secondary | ICD-10-CM | POA: Diagnosis not present

## 2022-11-01 DIAGNOSIS — H903 Sensorineural hearing loss, bilateral: Secondary | ICD-10-CM | POA: Diagnosis not present

## 2022-11-01 DIAGNOSIS — F419 Anxiety disorder, unspecified: Secondary | ICD-10-CM | POA: Diagnosis not present

## 2022-11-01 DIAGNOSIS — M47812 Spondylosis without myelopathy or radiculopathy, cervical region: Secondary | ICD-10-CM | POA: Diagnosis not present

## 2022-11-01 DIAGNOSIS — G20B1 Parkinson's disease with dyskinesia, without mention of fluctuations: Secondary | ICD-10-CM | POA: Diagnosis not present

## 2022-11-10 DIAGNOSIS — N189 Chronic kidney disease, unspecified: Secondary | ICD-10-CM | POA: Diagnosis not present

## 2022-11-10 DIAGNOSIS — G20B1 Parkinson's disease with dyskinesia, without mention of fluctuations: Secondary | ICD-10-CM | POA: Diagnosis not present

## 2022-11-10 DIAGNOSIS — M47812 Spondylosis without myelopathy or radiculopathy, cervical region: Secondary | ICD-10-CM | POA: Diagnosis not present

## 2022-11-10 DIAGNOSIS — F419 Anxiety disorder, unspecified: Secondary | ICD-10-CM | POA: Diagnosis not present

## 2022-11-10 DIAGNOSIS — Z9181 History of falling: Secondary | ICD-10-CM | POA: Diagnosis not present

## 2022-11-10 DIAGNOSIS — H903 Sensorineural hearing loss, bilateral: Secondary | ICD-10-CM | POA: Diagnosis not present

## 2022-11-15 DIAGNOSIS — H903 Sensorineural hearing loss, bilateral: Secondary | ICD-10-CM | POA: Diagnosis not present

## 2022-11-15 DIAGNOSIS — G20B1 Parkinson's disease with dyskinesia, without mention of fluctuations: Secondary | ICD-10-CM | POA: Diagnosis not present

## 2022-11-15 DIAGNOSIS — M47812 Spondylosis without myelopathy or radiculopathy, cervical region: Secondary | ICD-10-CM | POA: Diagnosis not present

## 2022-11-15 DIAGNOSIS — Z9181 History of falling: Secondary | ICD-10-CM | POA: Diagnosis not present

## 2022-11-15 DIAGNOSIS — F419 Anxiety disorder, unspecified: Secondary | ICD-10-CM | POA: Diagnosis not present

## 2022-11-15 DIAGNOSIS — N189 Chronic kidney disease, unspecified: Secondary | ICD-10-CM | POA: Diagnosis not present

## 2023-01-20 IMAGING — MR MR CERVICAL SPINE WO/W CM
4 of 8 series · 23 of 48 positions shown · IV contrast (multihance)
Comparison: None.

CLINICAL DATA: Ataxia, C-spine trauma

EXAM:
MRI CERVICAL SPINE WITHOUT AND WITH CONTRAST
TECHNIQUE: Multiplanar and multiecho pulse sequences of the cervical spine, to
include the craniocervical junction and cervicothoracic junction,
were obtained without and with intravenous contrast.
CONTRAST:  18mL MULTIHANCE GADOBENATE DIMEGLUMINE 529 MG/ML IV SOLN

[Series 3: T1 · sagittal · 3.0mm · 0.47mm/px · 4 of 17 slices shown (1 of 2)]
[im 1/17]
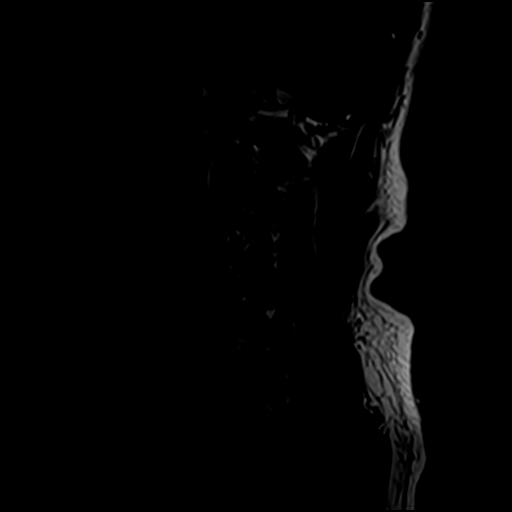
[im 6/17]
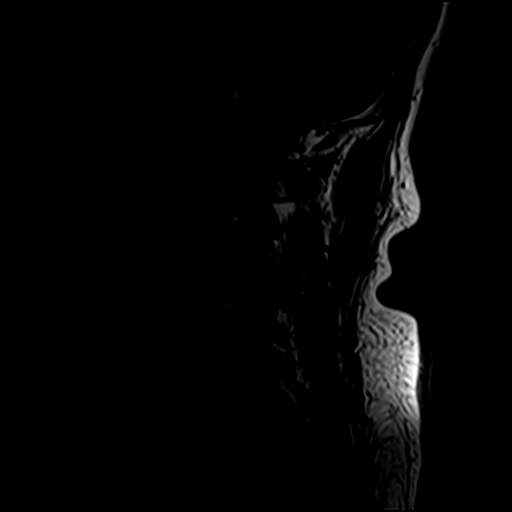
[im 11/17]
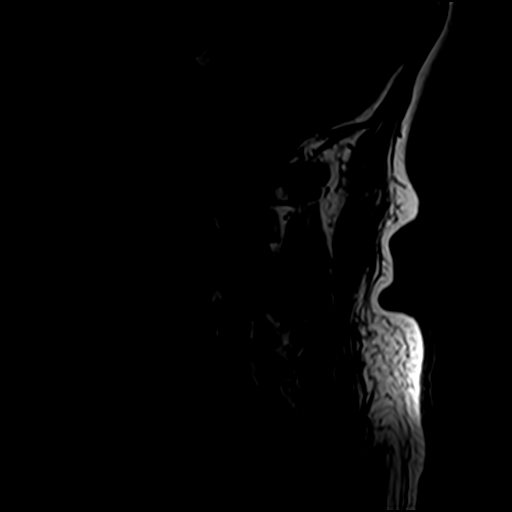
[im 17/17]
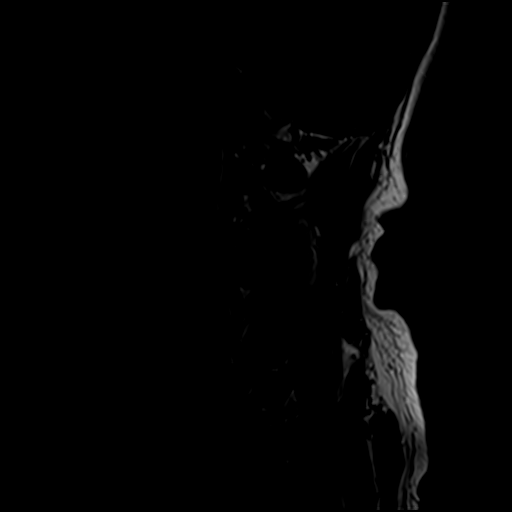

[Series 5: T2 · axial · 3.0mm · 0.70mm/px · z∈[-97,+6]mm · 8 of 29 slices shown]
[im 1/29]
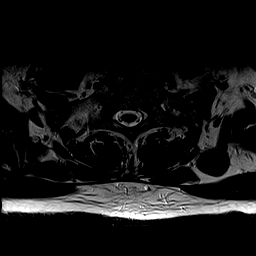
[im 5/29]
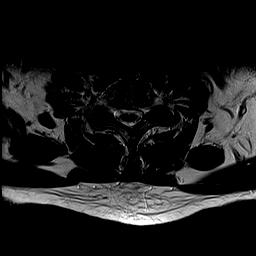
[im 9/29]
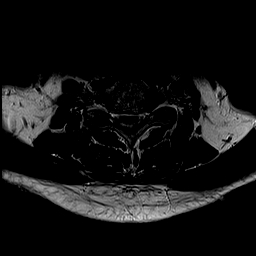
[im 13/29]
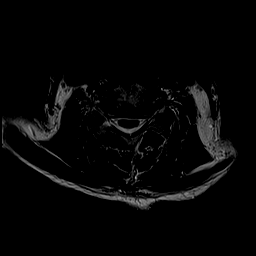
[im 17/29]
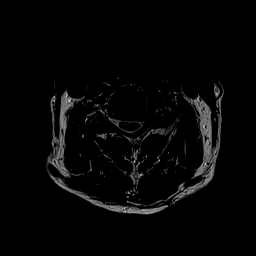
[im 21/29]
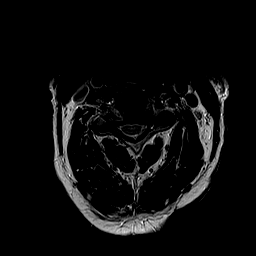
[im 25/29]
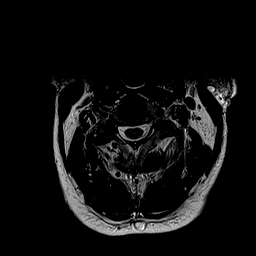
[im 29/29]
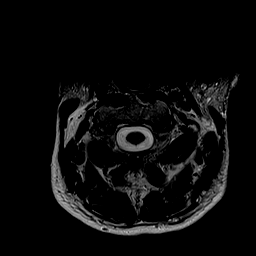

[Series 7: T1 · axial · 3.0mm · 0.35mm/px · z∈[-97,-9]mm · 7 of 29 slices shown (2 of 2)]
[im 1/29]
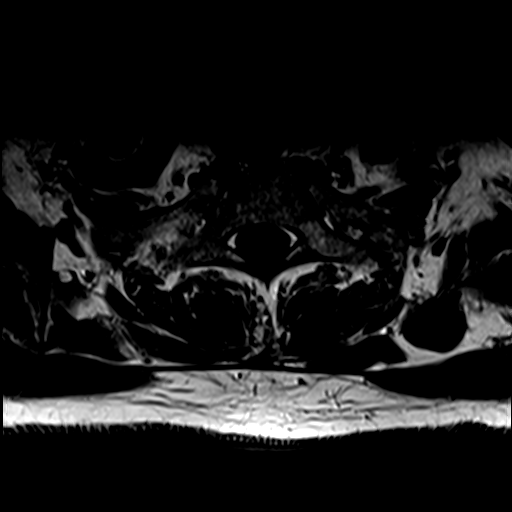
[im 5/29]
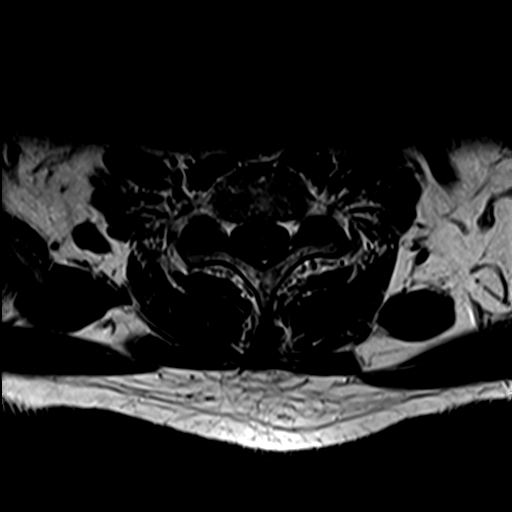
[im 9/29]
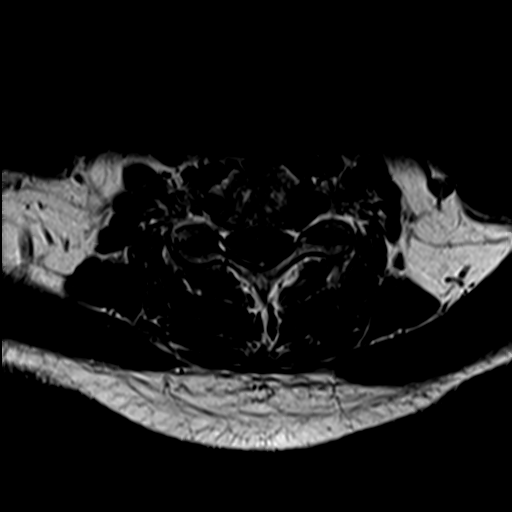
[im 13/29]
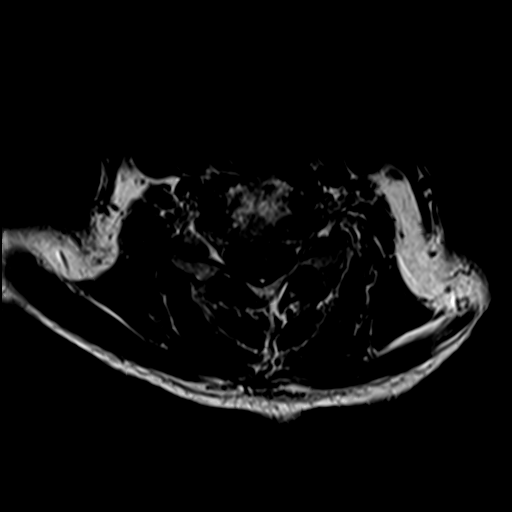
[im 17/29]
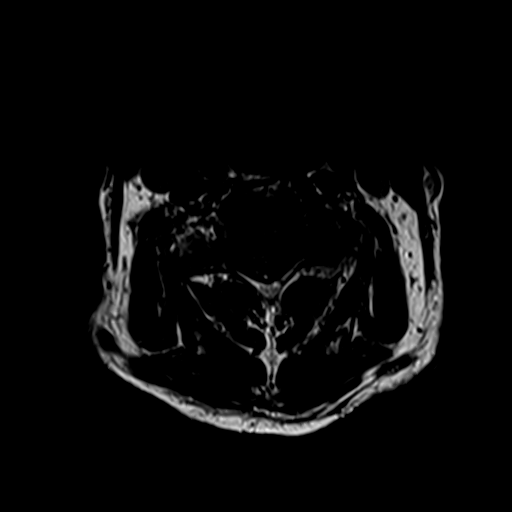
[im 21/29]
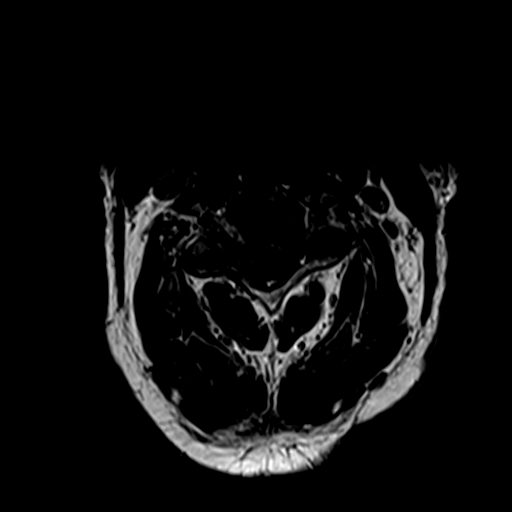
[im 25/29]
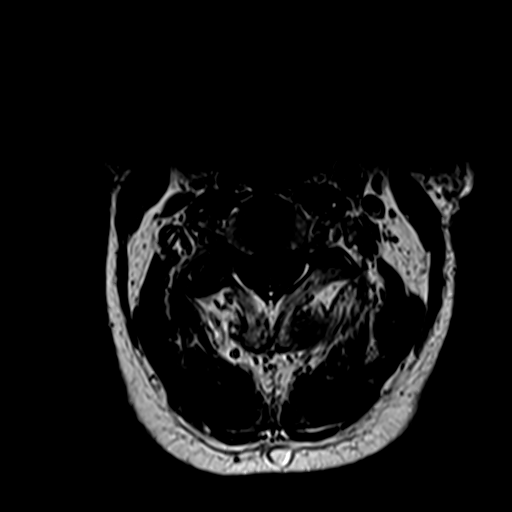

[Series 8: T2 post-contrast · sagittal · 3.0mm · 0.47mm/px · 4 of 17 slices shown]
[im 1/17]
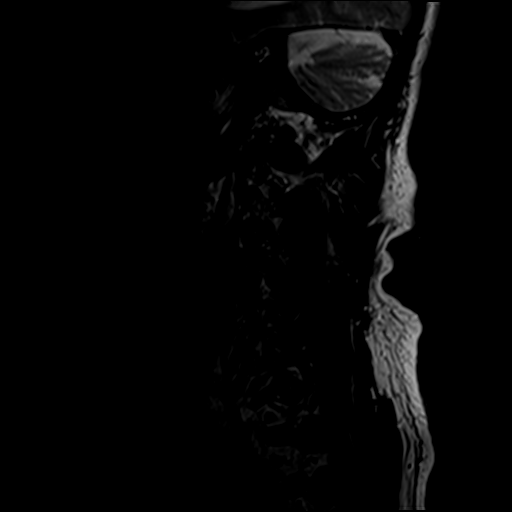
[im 6/17]
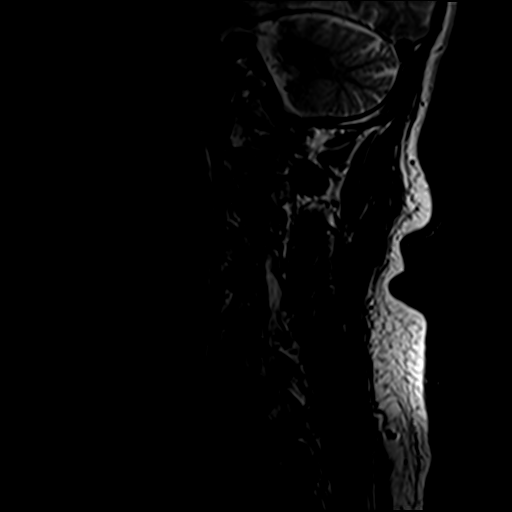
[im 11/17]
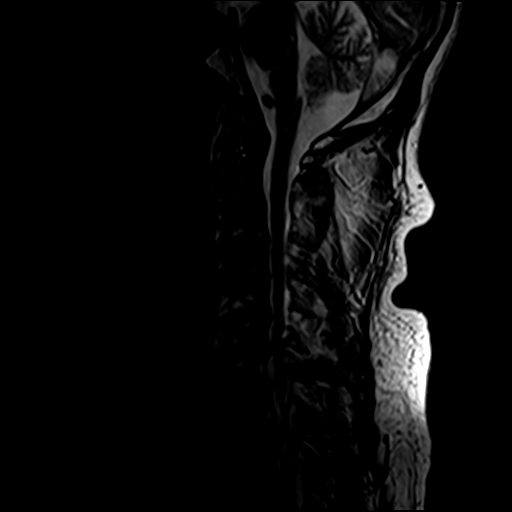
[im 17/17]
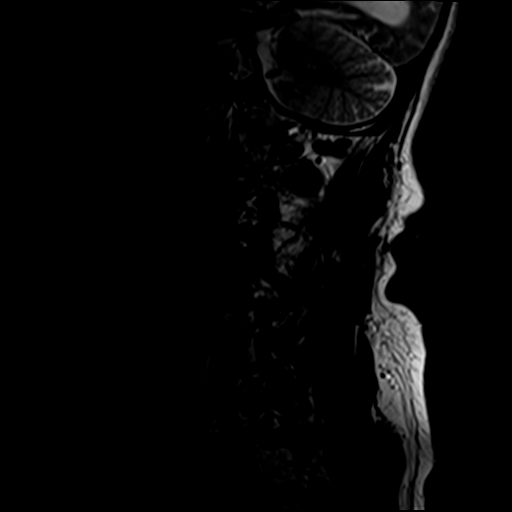

[23 of 48 positions shown; findings below may reference images not displayed]

FINDINGS: Alignment: Straightening and mild reversal the normal cervical
lordosis. No significant listhesis.

Vertebrae: No acute fracture or suspicious osseous lesion. Vertebral
body heights are generally preserved. Modic type 1 and type 2
endplate degenerative changes. No abnormal enhancement. Congenitally
short pedicles, which narrow the AP diameter of the spinal canal.

Cord: Focally increased T2 signal in the spinal cord at C6-C7
(series 5, image 23), which corresponds with an level of focal
spinal canal stenosis. No abnormal enhancement. Otherwise normal in
morphology and signal.

Posterior Fossa, vertebral arteries, paraspinal tissues: Negative.

Disc levels:

C2-C3: Disc bulge with superimposed right foraminal protrusion.
Moderate to severe facet and uncovertebral arthropathy. No spinal
canal stenosis. Mild-to-moderate left and severe right neural
foraminal narrowing.

C3-C4: Disc height loss with disc osteophyte complex. Facet and
uncovertebral hypertrophy. Mild spinal canal stenosis. Severe left
and moderate right neural foraminal narrowing.

C4-C5: Disc height loss. Left-greater-than-right uncovertebral and
facet arthropathy. Mild spinal canal stenosis. Moderate left neural
foraminal narrowing.

C5-C6: Disc height loss. Uncovertebral and facet arthropathy. No
spinal canal stenosis. Moderate left and moderate to severe right
neural foraminal narrowing.

C6-C7: Disc height loss with disc bulge and disc osteophyte complex.
Ligamentum flavum hypertrophy. Severe spinal canal stenosis.
Uncovertebral and facet arthropathy. Severe bilateral neural
foraminal narrowing.

C7-T1: Mild disc bulge. Facet arthropathy. No spinal canal stenosis.
Moderate left and mild right neural foraminal narrowing.
IMPRESSION: 1. C6-C7 severe spinal canal stenosis and severe bilateral neural
foraminal narrowing. There is focally increased T2 signal within the
spinal cord at this level, likely reflecting compressive myelopathy.
No abnormal enhancement.
2. C3-C4 mild spinal canal stenosis, with severe left and moderate
right neural foraminal narrowing.
3. C4-C5 mild spinal canal stenosis and moderate left neural
foraminal narrowing.
4. Additional neural foraminal narrowing without spinal canal
stenosis at C2-C3, C4-C5, and C7-T1, as described above.

These results will be called to the ordering clinician or
representative by the Radiologist Assistant, and communication
documented in the PACS or [REDACTED].

## 2023-03-26 NOTE — Progress Notes (Deleted)
 NEUROLOGY FOLLOW UP OFFICE NOTE  [name] [id number]  Assessment/Plan:   Parkinson's disease    Carbidopa-levodopa CR to 50/200mg  three times daily (9 AM, 2 PM, 9 PM) Follow up 6 months.  Total time spent reviewing notes and face to face with patient and daughter discussing diagnosis and plan:  ***     Subjective:  Donald Whitaker is an 83 year old right-handed male with CKD and bilateral sensorineural hearing loss who follows up for gait instability and testing results.  He is accompanied by his wife and daughter who provides additional history.   UPDATE: Current medication:  Carbidopa-levodopa CR 50/200mg  three times daily (9AM, 2PM, 9PM)  He has frequent urinary incontinence and occasionally bowel incontinence.  He wears diapers.  He is more drowsy during the day.  Takes 3 to 4 naps a day.  However, he is not sleeping all day and is able to remain active.  He has a stationary recumbent cycle to exercise his legs.  He uses a walker independently and a cane with assistance.  Falls ***.  He has had some mild cognitive decline.  He may have some trouble remembering details and following conversations.  ***  He sleeps well at night, especially since he resorts to the diaper rather than getting up to go to the toilet.  He has assistance, a retired Engineer, building services, who comes to the house 4 times a week.  He has an accessible bathroom and a hospital bed so it can be lowered     HISTORY: Around April 2022, he had a fall and couldn't get up on his own.  Then around June, he began noticing unsteady gait.  He says he feels weak in the legs.  When he walks he feels like he is going to tip over.  He has had several falls, usually falling forward, not backward.  He has been athletic and regularly walked and used the gym with weights.  He used to walk 60 minutes, now only 10 minutes.  No back pain, pain in legs or numbness in legs and feet.  He is able to walk up and down the stairs holding the  bannister but without difficulty.  Has history of cervical spondylosis but nothing significant and denies neck pain.  Denies back pain, leg pain or numbness in the feet.  Denies involvement of the upper extremities.  Denies dizziness, diplopia or dysphagia.  No bowel or bladder dysfunction.  Labs from August include CPK 44.  CT head without contrast on 09/25/2020 personally reviewed demonstrated brain atrophy and chronic small vessel ischemic changes but no acute abnormality or out of proportion ventriculomegaly.  MRI of brain with and without contrast on 11/13/2020 personally reviewed moderate chronic small vessel ischemic changes as well as mild ventricular enlargement which again appears to be due to moderate atrophy.  Denies urinary incontinence.  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  She was referred to neurosurgery and underwent ACDF and plating on 01/07/2021.  Despite cervical decompression and physical therapy, no improvement in his balance.  Therefore, underlying NPH was reconsidered.  He underwent high-volume LP on 09/12/2021 to evaluate for NPH.  Prior to the test, CT head was required, performed on 08/26/2021, which was personally reviewed and was stable without acute findings.  The high-volume LP findings demonstrated some improvement in mobility after the tap.  He was  referred to neurosurgery at [facility] where testing was not consistent with NPH.  He underwent DaT scan on 11/10/2021 which showed significant decreased radiotracer activity in the right striatum suggestive of Parkinson's syndrome.     He has had more short-term memory problems (repeating questions frequently).  Also is sometimes confused.  For example, one time he was talking to his daughter Donald Whitaker on the phone and then asked if she spoke to [place].  No urinary incontinence.  Denies double  vision.  Denies slurred speech or difficulty swallowing.  Denies hallucinations.  Denies tremors.  Denies difficulty with dexterity or movement of his upper extremities.  If he stands up, it may take a moment for him to move his legs. 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  He also has longstanding history of tardive dyskinesia/lip smacking.    Past medications:  C-L IR (night tremor, confusion)  PAST MEDICAL HISTORY: Past Medical History:  Diagnosis Date   Anxiety    Bilateral sensorineural hearing loss    CKD (chronic kidney disease)    Parkinson disease     MEDICATIONS: Current Outpatient Medications on File Prior to Visit  Medication Sig Dispense Refill   carbidopa-levodopa (SINEMET CR) 50-200 MG tablet Take 1 tablet by mouth 3 (three) times daily. 90 tablet 5   Multiple Vitamin (MULTI-VITAMIN) tablet Take 1 tablet by mouth daily.     Vitamin E (VITAMIN E/D-ALPHA NATURAL) 268 MG (400 UNIT) CAPS Take by mouth.     No current facility-administered medications on file prior to visit.    ALLERGIES: No Known Allergies  FAMILY HISTORY: Family History  Problem Relation Age of Onset   Cancer Mother    Heart attack Father    Throat cancer Sister    Asthma Brother       Objective:  *** General: No acute distress.  Patient appears well-groomed.   Head:  Normocephalic/atraumatic Neurological Exam: alert and oriented.  Speech fluent and not dysarthric, language intact.  Slight reduced upward gaze.  Otherwise, CN II-XII intact. Bulk and tone normal, no rigidity; muscle strength 5/5 throughout.  Mildly reduced finger-thumb tapping speed and amplitude.  No tremor.  Sensation to light touch intact.  Deep tendon reflexes 2+ throughout.  Finger to nose testing intact. Upward posture, magnetic gait with slight shuffling and slight right leg drag.     Donald Millet, DO  CC: Georgann Housekeeper, MD

## 2023-03-27 ENCOUNTER — Ambulatory Visit: Payer: Self-pay | Admitting: Neurology

## 2023-03-28 NOTE — Progress Notes (Unsigned)
 Virtual Visit via Video Note  Consent was obtained for video visit:  {yes no:314532} Answered questions that patient had about telehealth interaction:  {yes no:314532} I discussed the limitations, risks, security and privacy concerns of performing an evaluation and management service by telemedicine. I also discussed with the patient that there may be a patient responsible charge related to this service. The patient expressed understanding and agreed to proceed.  Pt location: Home Physician Location: office Name of referring provider:  Georgann Housekeeper, MD I connected with Foye Spurling Carboni at patients initiation/request on 03/29/2023 at 10:50 AM EST by video enabled telemedicine application and verified that I am speaking with the correct person using two identifiers. Pt MRN:  [id number] Pt DOB:  [birth year] Video Participants:  Foye Spurling Trimble;  ***   Assessment/Plan:   Parkinson's disease    Carbidopa-levodopa CR to 50/200mg  three times daily (9 AM, 2 PM, 9 PM) Follow up 6 months.  Total time spent reviewing notes and face to face with patient and daughter discussing diagnosis and plan:  ***     Subjective:  Donald Whitaker is an 83 year old right-handed male with CKD and bilateral sensorineural hearing loss who follows up for gait instability and testing results.  He is accompanied by his wife and daughter who provides additional history.   UPDATE: Current medication:  Carbidopa-levodopa CR 50/200mg  three times daily (9AM, 2PM, 9PM)  He has frequent urinary incontinence and occasionally bowel incontinence.  He wears diapers.  He is more drowsy during the day.  Takes 3 to 4 naps a day.  However, he is not sleeping all day and is able to remain active.  He has a stationary recumbent cycle to exercise his legs.  He uses a walker independently and a cane with assistance.  Falls ***.  He has had some mild cognitive decline.  He may have some trouble remembering details and  following conversations.  ***  He sleeps well at night, especially since he resorts to the diaper rather than getting up to go to the toilet.  He has assistance, a retired Engineer, building services, who comes to the house 4 times a week.  He has an accessible bathroom and a hospital bed so it can be lowered     HISTORY: Around April 2022, he had a fall and couldn't get up on his own.  Then around June, he began noticing unsteady gait.  He says he feels weak in the legs.  When he walks he feels like he is going to tip over.  He has had several falls, usually falling forward, not backward.  He has been athletic and regularly walked and used the gym with weights.  He used to walk 60 minutes, now only 10 minutes.  No back pain, pain in legs or numbness in legs and feet.  He is able to walk up and down the stairs holding the bannister but without difficulty.  Has history of cervical spondylosis but nothing significant and denies neck pain.  Denies back pain, leg pain or numbness in the feet.  Denies involvement of the upper extremities.  Denies dizziness, diplopia or dysphagia.  No bowel or bladder dysfunction.  Labs from August include CPK 44.  CT head without contrast on 09/25/2020 personally reviewed demonstrated brain atrophy and chronic small vessel ischemic changes but no acute abnormality or out of proportion ventriculomegaly.  MRI of brain with and without contrast on 11/13/2020 personally reviewed moderate chronic small vessel ischemic changes as well as  mild ventricular enlargement which again appears to be due to moderate atrophy.  Denies urinary incontinence.  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  She was referred to neurosurgery and underwent ACDF and plating on 01/07/2021.  Despite cervical decompression and physical therapy, no improvement in his balance.  Therefore,  underlying NPH was reconsidered.  He underwent high-volume LP on 09/12/2021 to evaluate for NPH.  Prior to the test, CT head was required, performed on 08/26/2021, which was personally reviewed and was stable without acute findings.  The high-volume LP findings demonstrated some improvement in mobility after the tap.  He was referred to neurosurgery at [facility] where testing was not consistent with NPH.  He underwent DaT scan on 11/10/2021 which showed significant decreased radiotracer activity in the right striatum suggestive of Parkinson's syndrome.     He has had more short-term memory problems (repeating questions frequently).  Also is sometimes confused.  For example, one time he was talking to his daughter [name] on the phone and then asked if she spoke to [place].  No urinary incontinence.  Denies double vision.  Denies slurred speech or difficulty swallowing.  Denies hallucinations.  Denies tremors.  Denies difficulty with dexterity or movement of his upper extremities.  If he stands up, it may take a moment for him to move his legs. 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  He also has longstanding history of tardive dyskinesia/lip smacking.    Past medications:  C-L IR (night tremor, confusion)  Past Medical History: Past Medical History:  Diagnosis Date   Anxiety    Bilateral sensorineural hearing loss    CKD (chronic kidney disease)    Parkinson disease     Medications: Outpatient Encounter Medications as of 03/29/2023  Medication Sig   carbidopa-levodopa (SINEMET CR) 50-200 MG tablet Take 1 tablet by mouth 3 (three) times daily.   Multiple Vitamin (MULTI-VITAMIN) tablet Take 1 tablet by mouth daily.   Vitamin E (VITAMIN E/D-ALPHA NATURAL) 268 MG (400 UNIT) CAPS Take by mouth.   No facility-administered encounter medications on file as of 03/29/2023.    Allergies: No Known Allergies  Family History: Family History  Problem Relation Age of Onset    Cancer Mother    Heart attack Father    Throat cancer Sister    Asthma Brother     Observations/Objective:   No acute distress.  Alert and oriented.  Speech fluent and not dysarthric.  Language intact.  Eyes orthophoric on primary gaze.  Face symmetric.   Follow Up Instructions:    -I discussed the assessment and treatment plan with the patient. The patient was provided an opportunity to ask questions and all were answered. The patient agreed with the plan and demonstrated an understanding of the instructions.   The patient was advised to call back or seek an in-person evaluation if the symptoms worsen or if the condition fails to improve as anticipated.   Cira Servant, DO   CC: Georgann Housekeeper, MD

## 2023-03-29 ENCOUNTER — Telehealth: Payer: Medicare PPO | Admitting: Neurology

## 2023-03-29 ENCOUNTER — Encounter: Payer: Self-pay | Admitting: Neurology

## 2023-03-29 DIAGNOSIS — G20A1 Parkinson's disease without dyskinesia, without mention of fluctuations: Secondary | ICD-10-CM

## 2023-03-29 MED ORDER — CARBIDOPA-LEVODOPA ER 50-200 MG PO TBCR: 1.0000 | EXTENDED_RELEASE_TABLET | Freq: Three times a day (TID) | ORAL | 5 refills | Status: DC

## 2023-05-29 ENCOUNTER — Telehealth: Payer: Self-pay | Admitting: Neurology

## 2023-05-29 DIAGNOSIS — G20A1 Parkinson's disease without dyskinesia, without mention of fluctuations: Secondary | ICD-10-CM

## 2023-05-29 NOTE — Telephone Encounter (Signed)
 Pt. Daughter having bad days unable to walk, would like to discuss upping dosage meds or other treatments to care for him

## 2023-05-30 ENCOUNTER — Telehealth: Payer: Self-pay

## 2023-05-30 NOTE — Telephone Encounter (Signed)
 Pt daughter called an informed Dr Festus Hubert wouldn't make any changes as Dr Festus Hubert hasn't  examined him in person for awhile.  Did he start another round of PT since last time?  If not, we can order it. Order placed for home PT. Pt would like to placed on wait list for sooner appointment

## 2023-05-30 NOTE — Telephone Encounter (Signed)
 PT start of care on F[facility] with [facility], pt daughter called an informed

## 2023-06-01 DIAGNOSIS — Z9181 History of falling: Secondary | ICD-10-CM | POA: Diagnosis not present

## 2023-06-01 DIAGNOSIS — G20A1 Parkinson's disease without dyskinesia, without mention of fluctuations: Secondary | ICD-10-CM | POA: Diagnosis not present

## 2023-06-01 DIAGNOSIS — G319 Degenerative disease of nervous system, unspecified: Secondary | ICD-10-CM | POA: Diagnosis not present

## 2023-06-01 DIAGNOSIS — I6782 Cerebral ischemia: Secondary | ICD-10-CM | POA: Diagnosis not present

## 2023-06-01 DIAGNOSIS — N189 Chronic kidney disease, unspecified: Secondary | ICD-10-CM | POA: Diagnosis not present

## 2023-06-01 DIAGNOSIS — F419 Anxiety disorder, unspecified: Secondary | ICD-10-CM | POA: Diagnosis not present

## 2023-06-11 ENCOUNTER — Telehealth: Payer: Self-pay

## 2023-06-11 DIAGNOSIS — I6782 Cerebral ischemia: Secondary | ICD-10-CM | POA: Diagnosis not present

## 2023-06-11 DIAGNOSIS — F419 Anxiety disorder, unspecified: Secondary | ICD-10-CM | POA: Diagnosis not present

## 2023-06-11 DIAGNOSIS — G319 Degenerative disease of nervous system, unspecified: Secondary | ICD-10-CM | POA: Diagnosis not present

## 2023-06-11 DIAGNOSIS — G20A1 Parkinson's disease without dyskinesia, without mention of fluctuations: Secondary | ICD-10-CM | POA: Diagnosis not present

## 2023-06-11 DIAGNOSIS — Z9181 History of falling: Secondary | ICD-10-CM | POA: Diagnosis not present

## 2023-06-11 DIAGNOSIS — N189 Chronic kidney disease, unspecified: Secondary | ICD-10-CM | POA: Diagnosis not present

## 2023-06-11 NOTE — Telephone Encounter (Signed)
 Patient declined PT on 06/08/23 Covid.

## 2023-06-15 ENCOUNTER — Ambulatory Visit: Admitting: Neurology

## 2023-06-15 ENCOUNTER — Encounter: Payer: Self-pay | Admitting: Neurology

## 2023-06-15 VITALS — BP 125/84 | HR 83 | Ht 72.0 in | Wt 196.0 lb

## 2023-06-15 DIAGNOSIS — G20A1 Parkinson's disease without dyskinesia, without mention of fluctuations: Secondary | ICD-10-CM

## 2023-06-15 DIAGNOSIS — F0390 Unspecified dementia without behavioral disturbance: Secondary | ICD-10-CM

## 2023-06-15 MED ORDER — CARBIDOPA-LEVODOPA ER 50-200 MG PO TBCR: 1.0000 | EXTENDED_RELEASE_TABLET | Freq: Four times a day (QID) | ORAL | 5 refills | Status: DC

## 2023-06-15 NOTE — Patient Instructions (Signed)
 Increase carbidopa -levodopa  50/200mg  CR - take 1 pill four times daily (at 9 AM, at 1 PM, at 5 PM and at 9 PM)

## 2023-06-15 NOTE — Progress Notes (Signed)
 NEUROLOGY FOLLOW UP OFFICE NOTE  DAVARIAN LOHSE [id number]  Assessment/Plan:   Parkinson's disease  Parkinson's dementia   As adjusting C-L to improve mobility will have a greater impact to quality of life, will increase C-L and hold off of starting a cholinesterase inhibitor. Increase carbidopa -levodopa  CR to 50/200mg  four times daily (9 AM, 1 PM, 5 PM and 9 PM) Follow up 3 months.     Subjective:  EPPIE PULE is an 83 year old right-handed male with CKD and bilateral sensorineural hearing loss who follows up for gait instability and testing results.  He is accompanied by his wife and daughter who provides additional history.   UPDATE: Current medication:  Carbidopa -levodopa  CR 50/200mg  three times daily (9AM, 2PM, 9PM)  He has assistance, a retired Engineer, building services, who comes to the house 7 times a week.  He has an accessible bathroom and a hospital bed so it can be lowered.  A couple of weeks ago, he became quite immobile for about 3 days but then bounced back.  Ambulates with the walker in the house.  Uses the stairs with assistance.  Has a lift recliner chair.  He has frequent urinary incontinence and occasionally bowel incontinence and wears diapers.  No hallucinations or paranoia.  Sometimes irritable due to limited independence but overall has a good outlook.  Sleeps well, from 9 PM to 9 AM.  May take one or two naps during the day.  Has exhibited cognitive decline.  Has gotten lost in the house.  Has trouble remembering details and following conversations.  Frequently requires redirection.  Appetite is good.     HISTORY: Around April 2022, he had a fall and couldn't get up on his own.  Then around June, he began noticing unsteady gait.  He says he feels weak in the legs.  When he walks he feels like he is going to tip over.  He has had several falls, usually falling forward, not backward.  He has been athletic and regularly walked and used the gym with weights.  He used to  walk 60 minutes, now only 10 minutes.  No back pain, pain in legs or numbness in legs and feet.  He is able to walk up and down the stairs holding the bannister but without difficulty.  Has history of cervical spondylosis but nothing significant and denies neck pain.  Denies back pain, leg pain or numbness in the feet.  Denies involvement of the upper extremities.  Denies dizziness, diplopia or dysphagia.  No bowel or bladder dysfunction.  Labs from August include CPK 44.  CT head without contrast on 09/25/2020 personally reviewed demonstrated brain atrophy and chronic small vessel ischemic changes but no acute abnormality or out of proportion ventriculomegaly.  MRI of brain with and without contrast on 11/13/2020 personally reviewed moderate chronic small vessel ischemic changes as well as mild ventricular enlargement which again appears to be due to moderate atrophy.  Denies urinary incontinence.  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  She was referred to neurosurgery and underwent ACDF and plating on 01/07/2021.  Despite cervical decompression and physical therapy, no improvement in his balance.  Therefore, underlying NPH was reconsidered.  He underwent high-volume LP on 09/12/2021 to evaluate for NPH.  Prior to the test, CT head was required, performed on 08/26/2021, which was personally reviewed and was stable without acute findings.  The high-volume LP findings demonstrated some improvement in mobility after the tap.  He was referred to neurosurgery at [facility] where testing was not consistent with NPH.  He underwent DaT scan on 11/10/2021 which showed significant decreased radiotracer activity in the right striatum suggestive of Parkinson's syndrome.     He has had more short-term memory problems (repeating questions frequently).  Also is sometimes confused.   For example, one time he was talking to his daughter Sherline Distel on the phone and then asked if she spoke to [place].  No urinary incontinence.  Denies double vision.  Denies slurred speech or difficulty swallowing.  Denies hallucinations.  Denies tremors.  Denies difficulty with dexterity or movement of his upper extremities.  If he stands up, it may take a moment for him to move his legs. 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  He also has longstanding history of tardive dyskinesia/lip smacking.    Past medications:  C-L IR (night tremor, confusion)  PAST MEDICAL HISTORY: Past Medical History:  Diagnosis Date   Anxiety    Bilateral sensorineural hearing loss    CKD (chronic kidney disease)    Parkinson disease (HCC)     MEDICATIONS: Current Outpatient Medications on File Prior to Visit  Medication Sig Dispense Refill   carbidopa -levodopa  (SINEMET  CR) 50-200 MG tablet Take 1 tablet by mouth 3 (three) times daily. 90 tablet 5   Multiple Vitamin (MULTI-VITAMIN) tablet Take 1 tablet by mouth daily.     Vitamin E (VITAMIN E/D-ALPHA NATURAL) 268 MG (400 UNIT) CAPS Take by mouth.     No current facility-administered medications on file prior to visit.    ALLERGIES: No Known Allergies  FAMILY HISTORY: Family History  Problem Relation Age of Onset   Cancer Mother    Heart attack Father    Throat cancer Sister    Asthma Brother       Objective:  Blood pressure 125/84, pulse 83, height 6' (1.829 m), weight 196 lb (88.9 kg), SpO2 92%. General: No acute distress.  Patient appears well-groomed.   Head:  Normocephalic/atraumatic Eyes:  Fundi examined but not visualized Neck: supple, no paraspinal tenderness, full range of motion Heart:  Regular rate and rhythm Neurological Exam: alert and oriented to self and place but not time (except for season).  Speech fluent and not dysarthric, language intact.  CN II-XII intact. No significant rigidity, muscle strength  5/5 throughout.  Mildly reduced finger-thumb tapping speed and amplitude.  No tremor.  Sensation to light touch intact.  Deep tendon reflexes 2+ throughout, toes downgoing.  Finger to nose testing intact.  Requires assistance standing up from wheelchair and ambulating.  Quiet unsteady.  Upward posture, magnetic gait with short steps.     Janne Members, DO  CC: Jearldine Mina, MD

## 2023-06-18 DIAGNOSIS — G319 Degenerative disease of nervous system, unspecified: Secondary | ICD-10-CM | POA: Diagnosis not present

## 2023-06-18 DIAGNOSIS — N189 Chronic kidney disease, unspecified: Secondary | ICD-10-CM | POA: Diagnosis not present

## 2023-06-18 DIAGNOSIS — G20A1 Parkinson's disease without dyskinesia, without mention of fluctuations: Secondary | ICD-10-CM | POA: Diagnosis not present

## 2023-06-18 DIAGNOSIS — F419 Anxiety disorder, unspecified: Secondary | ICD-10-CM | POA: Diagnosis not present

## 2023-06-18 DIAGNOSIS — I6782 Cerebral ischemia: Secondary | ICD-10-CM | POA: Diagnosis not present

## 2023-06-18 DIAGNOSIS — Z9181 History of falling: Secondary | ICD-10-CM | POA: Diagnosis not present

## 2023-06-20 ENCOUNTER — Telehealth: Payer: Self-pay | Admitting: Neurology

## 2023-06-20 DIAGNOSIS — Z9181 History of falling: Secondary | ICD-10-CM | POA: Diagnosis not present

## 2023-06-20 DIAGNOSIS — G319 Degenerative disease of nervous system, unspecified: Secondary | ICD-10-CM | POA: Diagnosis not present

## 2023-06-20 DIAGNOSIS — F419 Anxiety disorder, unspecified: Secondary | ICD-10-CM | POA: Diagnosis not present

## 2023-06-20 DIAGNOSIS — I6782 Cerebral ischemia: Secondary | ICD-10-CM | POA: Diagnosis not present

## 2023-06-20 DIAGNOSIS — N189 Chronic kidney disease, unspecified: Secondary | ICD-10-CM | POA: Diagnosis not present

## 2023-06-20 DIAGNOSIS — G20A1 Parkinson's disease without dyskinesia, without mention of fluctuations: Secondary | ICD-10-CM | POA: Diagnosis not present

## 2023-06-20 NOTE — Telephone Encounter (Signed)
 Tried calling to check on the patient no answer.

## 2023-06-20 NOTE — Telephone Encounter (Signed)
 [facility] called in stating she saw the patient today and his wife reported that the pt fell yesterday. He did not get hurt.

## 2023-06-26 DIAGNOSIS — G319 Degenerative disease of nervous system, unspecified: Secondary | ICD-10-CM | POA: Diagnosis not present

## 2023-06-26 DIAGNOSIS — G20A1 Parkinson's disease without dyskinesia, without mention of fluctuations: Secondary | ICD-10-CM | POA: Diagnosis not present

## 2023-06-26 DIAGNOSIS — I6782 Cerebral ischemia: Secondary | ICD-10-CM | POA: Diagnosis not present

## 2023-06-26 DIAGNOSIS — F419 Anxiety disorder, unspecified: Secondary | ICD-10-CM | POA: Diagnosis not present

## 2023-06-26 DIAGNOSIS — Z9181 History of falling: Secondary | ICD-10-CM | POA: Diagnosis not present

## 2023-06-26 DIAGNOSIS — N189 Chronic kidney disease, unspecified: Secondary | ICD-10-CM | POA: Diagnosis not present

## 2023-07-04 DIAGNOSIS — G20A1 Parkinson's disease without dyskinesia, without mention of fluctuations: Secondary | ICD-10-CM | POA: Diagnosis not present

## 2023-07-04 DIAGNOSIS — N189 Chronic kidney disease, unspecified: Secondary | ICD-10-CM | POA: Diagnosis not present

## 2023-07-04 DIAGNOSIS — I6782 Cerebral ischemia: Secondary | ICD-10-CM | POA: Diagnosis not present

## 2023-07-04 DIAGNOSIS — F419 Anxiety disorder, unspecified: Secondary | ICD-10-CM | POA: Diagnosis not present

## 2023-07-04 DIAGNOSIS — Z9181 History of falling: Secondary | ICD-10-CM | POA: Diagnosis not present

## 2023-07-04 DIAGNOSIS — G319 Degenerative disease of nervous system, unspecified: Secondary | ICD-10-CM | POA: Diagnosis not present

## 2023-07-10 DIAGNOSIS — F419 Anxiety disorder, unspecified: Secondary | ICD-10-CM | POA: Diagnosis not present

## 2023-07-10 DIAGNOSIS — N189 Chronic kidney disease, unspecified: Secondary | ICD-10-CM | POA: Diagnosis not present

## 2023-07-10 DIAGNOSIS — Z9181 History of falling: Secondary | ICD-10-CM | POA: Diagnosis not present

## 2023-07-10 DIAGNOSIS — G20A1 Parkinson's disease without dyskinesia, without mention of fluctuations: Secondary | ICD-10-CM | POA: Diagnosis not present

## 2023-07-10 DIAGNOSIS — I6782 Cerebral ischemia: Secondary | ICD-10-CM | POA: Diagnosis not present

## 2023-07-10 DIAGNOSIS — G319 Degenerative disease of nervous system, unspecified: Secondary | ICD-10-CM | POA: Diagnosis not present

## 2023-07-12 ENCOUNTER — Ambulatory Visit: Payer: Medicare PPO | Admitting: Neurology

## 2023-09-11 NOTE — Progress Notes (Unsigned)
 NEUROLOGY FOLLOW UP OFFICE NOTE  Donald Whitaker [id number]  Assessment/Plan:   Parkinson's disease  Parkinson's dementia   Initiate *** Carbidopa -levodopa  CR to 50/200mg  four times daily (9 AM, 1 PM, 5 PM and 9 PM) Follow up 3 months.     Subjective:  Donald Whitaker is an 83 year old right-handed male with CKD and bilateral sensorineural hearing loss who follows up for gait instability and testing results.  He is accompanied by his wife and daughter who provides additional history.   UPDATE: Current medication:  Carbidopa -levodopa  CR 50/200mg  four times daily (9AM, 1PM, 5PM, 9PM)  Increased C-L CR to 4 times daily. ***  He has assistance, a retired Engineer, building services, who comes to the house 7 times a week.  He has an accessible bathroom and a hospital bed so it can be lowered.  A couple of weeks ago, he became quite immobile for about 3 days but then bounced back.  Ambulates with the walker in the house.  Uses the stairs with assistance.  Has a lift recliner chair.  He has frequent urinary incontinence and occasionally bowel incontinence and wears diapers.  No hallucinations or paranoia.  Sometimes irritable due to limited independence but overall has a good outlook.  Sleeps well, from 9 PM to 9 AM.  May take one or two naps during the day.  Has exhibited cognitive decline.  Has gotten lost in the house.  Has trouble remembering details and following conversations.  Frequently requires redirection.  Appetite is good.     HISTORY: Around April 2022, he had a fall and couldn't get up on his own.  Then around June, he began noticing unsteady gait.  He says he feels weak in the legs.  When he walks he feels like he is going to tip over.  He has had several falls, usually falling forward, not backward.  He has been athletic and regularly walked and used the gym with weights.  He used to walk 60 minutes, now only 10 minutes.  No back pain, pain in legs or numbness in legs and feet.  He is  able to walk up and down the stairs holding the bannister but without difficulty.  Has history of cervical spondylosis but nothing significant and denies neck pain.  Denies back pain, leg pain or numbness in the feet.  Denies involvement of the upper extremities.  Denies dizziness, diplopia or dysphagia.  No bowel or bladder dysfunction.  Labs from August include CPK 44.  CT head without contrast on 09/25/2020 personally reviewed demonstrated brain atrophy and chronic small vessel ischemic changes but no acute abnormality or out of proportion ventriculomegaly.  MRI of brain with and without contrast on 11/13/2020 personally reviewed moderate chronic small vessel ischemic changes as well as mild ventricular enlargement which again appears to be due to moderate atrophy.  Denies urinary incontinence.  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  She was referred to neurosurgery and underwent ACDF and plating on 01/07/2021.  Despite cervical decompression and physical therapy, no improvement in his balance.  Therefore, underlying NPH was reconsidered.  He underwent high-volume LP on 09/12/2021 to evaluate for NPH.  Prior to the test, CT head was required, performed on 08/26/2021, which was personally reviewed and was stable without acute findings.  The high-volume LP findings demonstrated some improvement in mobility after the tap.  He  was referred to neurosurgery at [facility] where testing was not consistent with NPH.  He underwent DaT scan on 11/10/2021 which showed significant decreased radiotracer activity in the right striatum suggestive of Parkinson's syndrome.     He has had more short-term memory problems (repeating questions frequently).  Also is sometimes confused.  For example, one time he was talking to his daughter Donald Whitaker on the phone and then asked if she spoke to  Donald Whitaker.  No urinary incontinence.  Denies double vision.  Denies slurred speech or difficulty swallowing.  Denies hallucinations.  Denies tremors.  Denies difficulty with dexterity or movement of his upper extremities.  If he stands up, it may take a moment for him to move his legs. 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  He also has longstanding history of tardive dyskinesia/lip smacking.    Past medications:  C-L IR (night tremor, confusion)  PAST MEDICAL HISTORY: Past Medical History:  Diagnosis Date   Anxiety    Bilateral sensorineural hearing loss    CKD (chronic kidney disease)    Parkinson disease (HCC)     MEDICATIONS: Current Outpatient Medications on File Prior to Visit  Medication Sig Dispense Refill   carbidopa -levodopa  (SINEMET  CR) 50-200 MG tablet Take 1 tablet by mouth 4 (four) times daily. Take at 9AM, 1PM, 5PM and 9PM 120 tablet 5   Multiple Vitamin (MULTI-VITAMIN) tablet Take 1 tablet by mouth daily.     Vitamin E (VITAMIN E/D-ALPHA NATURAL) 268 MG (400 UNIT) CAPS Take by mouth. (Patient not taking: Reported on 06/15/2023)     No current facility-administered medications on file prior to visit.    ALLERGIES: No Known Allergies  FAMILY HISTORY: Family History  Problem Relation Age of Onset   Cancer Mother    Heart attack Father    Throat cancer Sister    Asthma Brother       Objective:  *** General: No acute distress.  Patient appears well-groomed.   Head:  Normocephalic/atraumatic Eyes:  Fundi examined but not visualized Neck: supple, no paraspinal tenderness, full range of motion Heart:  Regular rate and rhythm Neurological Exam: Alert and oriented to ***.  Speech fluent and not dysarthric.  Language intact.  CN II-XII intact.  No significant rigidity.  Muscle strength 5/5 throughout.  Mildly reduced finger-thumb tapping speed and amplitude.  No tremor.  Sensation to light touch intact.  Deep tendon reflexes 2+  throughout.  Finger to nose testing intact. ***.  Upward posture, magnetic gait with short strides.    Donald Dunnings, DO  CC: Donald Manly, MD

## 2023-09-12 ENCOUNTER — Encounter: Payer: Self-pay | Admitting: Neurology

## 2023-09-12 ENCOUNTER — Ambulatory Visit: Admitting: Neurology

## 2023-09-12 VITALS — BP 153/87 | HR 62 | Resp 20 | Ht 73.0 in

## 2023-09-12 DIAGNOSIS — G20A1 Parkinson's disease without dyskinesia, without mention of fluctuations: Secondary | ICD-10-CM

## 2023-09-12 DIAGNOSIS — F0390 Unspecified dementia without behavioral disturbance: Secondary | ICD-10-CM

## 2023-09-12 MED ORDER — MEMANTINE HCL 5 MG PO TABS: ORAL_TABLET | ORAL | 0 refills | Status: DC

## 2023-09-12 NOTE — Patient Instructions (Signed)
 Start Namenda  (memantine ) 5mg  tablets.  Take 1 tablet at bedtime for 7 days, then 1 tablet twice daily for 7 days, then 2 tablets twice daily.   Side effects include dizziness, headache, diarrhea or constipation.  Call with any questions or concerns. Continue carbidopa -levodopa  CR 50/200mg  four times daily (9AM, 1PM, 5PM and 9PM) Continue exercise/physical activity with assistance as tolerated Follow up 6 months.

## 2023-10-23 ENCOUNTER — Telehealth: Payer: Self-pay | Admitting: Neurology

## 2023-10-23 ENCOUNTER — Other Ambulatory Visit: Payer: Self-pay | Admitting: Neurology

## 2023-10-23 MED ORDER — MEMANTINE HCL 10 MG PO TABS: 10.0000 mg | ORAL_TABLET | Freq: Two times a day (BID) | ORAL | 5 refills | Status: DC

## 2023-10-23 NOTE — Telephone Encounter (Signed)
 Pt. Asking for refill, LMOM after nurse line will be out today

## 2023-10-25 NOTE — Telephone Encounter (Signed)
 Advised

## 2023-11-14 ENCOUNTER — Other Ambulatory Visit: Payer: Self-pay | Admitting: Neurology

## 2023-11-15 DIAGNOSIS — L719 Rosacea, unspecified: Secondary | ICD-10-CM | POA: Diagnosis not present

## 2023-11-15 DIAGNOSIS — Z0000 Encounter for general adult medical examination without abnormal findings: Secondary | ICD-10-CM | POA: Diagnosis not present

## 2023-11-15 DIAGNOSIS — R269 Unspecified abnormalities of gait and mobility: Secondary | ICD-10-CM | POA: Diagnosis not present

## 2023-11-15 DIAGNOSIS — Z23 Encounter for immunization: Secondary | ICD-10-CM | POA: Diagnosis not present

## 2023-11-15 DIAGNOSIS — E038 Other specified hypothyroidism: Secondary | ICD-10-CM | POA: Diagnosis not present

## 2023-11-15 DIAGNOSIS — N182 Chronic kidney disease, stage 2 (mild): Secondary | ICD-10-CM | POA: Diagnosis not present

## 2023-11-15 DIAGNOSIS — Z1331 Encounter for screening for depression: Secondary | ICD-10-CM | POA: Diagnosis not present

## 2023-11-15 DIAGNOSIS — G20A1 Parkinson's disease without dyskinesia, without mention of fluctuations: Secondary | ICD-10-CM | POA: Diagnosis not present

## 2023-11-15 DIAGNOSIS — E782 Mixed hyperlipidemia: Secondary | ICD-10-CM | POA: Diagnosis not present

## 2023-11-15 DIAGNOSIS — R7303 Prediabetes: Secondary | ICD-10-CM | POA: Diagnosis not present

## 2023-11-15 DIAGNOSIS — I700 Atherosclerosis of aorta: Secondary | ICD-10-CM | POA: Diagnosis not present

## 2024-03-12 ENCOUNTER — Other Ambulatory Visit: Payer: Self-pay | Admitting: Neurology

## 2024-03-26 ENCOUNTER — Ambulatory Visit: Admitting: Neurology
